# Patient Record
Sex: Female | Born: 1985 | Race: White | Hispanic: No | Smoking: Former smoker
Health system: Southern US, Community
[De-identification: ages and names within clinical notes are randomized; demographics above are authoritative.]

## PROBLEM LIST (undated history)

## (undated) DIAGNOSIS — F419 Anxiety disorder, unspecified: Secondary | ICD-10-CM

## (undated) DIAGNOSIS — T7840XA Allergy, unspecified, initial encounter: Secondary | ICD-10-CM

## (undated) DIAGNOSIS — F321 Major depressive disorder, single episode, moderate: Secondary | ICD-10-CM

## (undated) DIAGNOSIS — D649 Anemia, unspecified: Secondary | ICD-10-CM

## (undated) DIAGNOSIS — G4700 Insomnia, unspecified: Secondary | ICD-10-CM

## (undated) DIAGNOSIS — G43909 Migraine, unspecified, not intractable, without status migrainosus: Secondary | ICD-10-CM

## (undated) HISTORY — DX: Allergy, unspecified, initial encounter: T78.40XA

## (undated) HISTORY — DX: Anxiety disorder, unspecified: F41.9

## (undated) HISTORY — DX: Insomnia, unspecified: G47.00

## (undated) HISTORY — DX: Major depressive disorder, single episode, moderate: F32.1

## (undated) HISTORY — DX: Anemia, unspecified: D64.9

## (undated) HISTORY — DX: Migraine, unspecified, not intractable, without status migrainosus: G43.909

---

## 2009-12-09 HISTORY — PX: WISDOM TOOTH EXTRACTION: SHX21

## 2012-02-21 ENCOUNTER — Ambulatory Visit: Payer: BC Managed Care – PPO | Admitting: Family Medicine

## 2012-11-19 ENCOUNTER — Ambulatory Visit: Payer: Self-pay | Admitting: Family Medicine

## 2013-09-01 ENCOUNTER — Emergency Department: Payer: Self-pay | Admitting: Emergency Medicine

## 2013-09-01 LAB — COMPREHENSIVE METABOLIC PANEL
Albumin: 4.5 g/dL (ref 3.4–5.0)
Alkaline Phosphatase: 72 U/L (ref 50–136)
Anion Gap: 6 — ABNORMAL LOW (ref 7–16)
BUN: 15 mg/dL (ref 7–18)
Bilirubin,Total: 0.6 mg/dL (ref 0.2–1.0)
Calcium, Total: 9.5 mg/dL (ref 8.5–10.1)
Chloride: 107 mmol/L (ref 98–107)
Co2: 26 mmol/L (ref 21–32)
Creatinine: 0.82 mg/dL (ref 0.60–1.30)
EGFR (African American): >60
EGFR (Non-African Amer.): >60
Glucose: 124 mg/dL — ABNORMAL HIGH (ref 65–99)
Osmolality: 280 (ref 275–301)
SGPT (ALT): 21 U/L (ref 12–78)
Sodium: 139 mmol/L (ref 136–145)
Total Protein: 8.1 g/dL (ref 6.4–8.2)

## 2013-09-01 LAB — PREGNANCY, URINE: Pregnancy Test, Urine: NEGATIVE m[IU]/mL

## 2013-09-01 LAB — CBC
HCT: 44.7 % (ref 35.0–47.0)
HGB: 15.6 g/dL (ref 12.0–16.0)
MCHC: 34.8 g/dL (ref 32.0–36.0)
MCV: 90 fL (ref 80–100)
Platelet: 261 10*3/uL (ref 150–440)
RBC: 4.96 10*6/uL (ref 3.80–5.20)
RDW: 11.5 % (ref 11.5–14.5)

## 2013-09-01 LAB — URINALYSIS, COMPLETE
Glucose,UR: NEGATIVE mg/dL (ref 0–75)
Leukocyte Esterase: NEGATIVE
Nitrite: NEGATIVE
Ph: 7 (ref 4.5–8.0)
RBC,UR: NONE SEEN /HPF (ref 0–5)
Specific Gravity: 1.024 (ref 1.003–1.030)
WBC UR: 2 /HPF (ref 0–5)

## 2014-08-30 ENCOUNTER — Observation Stay: Payer: Self-pay | Admitting: Obstetrics and Gynecology

## 2014-09-08 ENCOUNTER — Inpatient Hospital Stay: Payer: Self-pay | Admitting: Obstetrics and Gynecology

## 2014-09-08 LAB — CBC WITH DIFFERENTIAL/PLATELET
Basophil #: 0.1 10*3/uL (ref 0.0–0.1)
Basophil %: 0.5 %
Eosinophil #: 0.1 10*3/uL (ref 0.0–0.7)
Eosinophil %: 1 %
HCT: 40.2 % (ref 35.0–47.0)
HGB: 13.3 g/dL (ref 12.0–16.0)
LYMPHS ABS: 2 10*3/uL (ref 1.0–3.6)
LYMPHS PCT: 13.8 %
MCH: 30.5 pg (ref 26.0–34.0)
MCHC: 33.2 g/dL (ref 32.0–36.0)
MCV: 92 fL (ref 80–100)
Monocyte #: 0.8 x10 3/mm (ref 0.2–0.9)
Monocyte %: 5.3 %
Neutrophil #: 11.5 10*3/uL — ABNORMAL HIGH (ref 1.4–6.5)
Neutrophil %: 79.4 %
Platelet: 206 10*3/uL (ref 150–440)
RBC: 4.36 10*6/uL (ref 3.80–5.20)
RDW: 11.9 % (ref 11.5–14.5)
WBC: 14.5 10*3/uL — ABNORMAL HIGH (ref 3.6–11.0)

## 2014-09-09 LAB — HEMATOCRIT: HCT: 33.6 % — ABNORMAL LOW (ref 35.0–47.0)

## 2015-04-18 NOTE — H&P (Signed)
L&D Evaluation:  History:  HPI 28yo SWF presents at 7360w5d in early labor, Normal PNC to date.   Presents with contractions   Patient's Medical History No Chronic Illness   Patient's Surgical History none   Medications Pre Natal Vitamins  Tylenol (Acetaminophen)   Allergies other, Ceclor   Social History tobacco   ROS:  ROS All systems were reviewed.  HEENT, CNS, GI, GU, Respiratory, CV, Renal and Musculoskeletal systems were found to be normal.   Exam:  Vital Signs stable   General no apparent distress   Mental Status clear   Chest clear   Heart normal sinus rhythm   Abdomen gravid, tender with contractions   Estimated Fetal Weight Average for gestational age   Fetal Position vtx   Back no CVAT   Edema no edema   Pelvic 5.5/90/-1   Mebranes Ruptured   Description bloody, light green   FHT normal rate with no decels   Ucx regular   Impression:  Impression active labor   Plan:  Plan monitor contractions and for cervical change, fluids, AROM. Epidyral when desires   Electronic Signatures: Ulyses AmorBurr, Melody N (CNM)  (Signed 01-Oct-15 08:32)  Authored: L&D Evaluation   Last Updated: 01-Oct-15 08:32 by Ulyses AmorBurr, Melody N (CNM)

## 2015-06-02 ENCOUNTER — Ambulatory Visit (INDEPENDENT_AMBULATORY_CARE_PROVIDER_SITE_OTHER): Payer: Self-pay | Admitting: Family Medicine

## 2015-06-02 ENCOUNTER — Encounter: Payer: Self-pay | Admitting: Family Medicine

## 2015-06-02 ENCOUNTER — Encounter (INDEPENDENT_AMBULATORY_CARE_PROVIDER_SITE_OTHER): Payer: Self-pay

## 2015-06-02 VITALS — BP 90/64 | HR 82 | Temp 97.9°F | Resp 18 | Ht 65.0 in | Wt 90.4 lb

## 2015-06-02 DIAGNOSIS — F339 Major depressive disorder, recurrent, unspecified: Secondary | ICD-10-CM

## 2015-06-02 DIAGNOSIS — F419 Anxiety disorder, unspecified: Secondary | ICD-10-CM | POA: Insufficient documentation

## 2015-06-02 DIAGNOSIS — F331 Major depressive disorder, recurrent, moderate: Secondary | ICD-10-CM | POA: Insufficient documentation

## 2015-06-02 DIAGNOSIS — G43909 Migraine, unspecified, not intractable, without status migrainosus: Secondary | ICD-10-CM | POA: Insufficient documentation

## 2015-06-02 MED ORDER — ESCITALOPRAM OXALATE 10 MG PO TABS: 10.0000 mg | ORAL_TABLET | Freq: Every day | ORAL | Status: DC

## 2015-06-02 MED ORDER — ALPRAZOLAM 0.5 MG PO TABS: 0.5000 mg | ORAL_TABLET | Freq: Two times a day (BID) | ORAL | Status: DC | PRN

## 2015-06-02 NOTE — Progress Notes (Signed)
Name: Annette Bowers   MRN: [id number]    DOB: [birth year]   Date:06/02/2015       Progress Note  Subjective  Chief Complaint  Chief Complaint  Patient presents with  . Depression  . Anxiety    Patient states that the medication doesn't seem to be helping.     Anxiety Presents for follow-up visit. The problem has been gradually worsening. Symptoms include depressed mood, excessive worry, malaise and nervous/anxious behavior. Patient reports no shortness of breath. Symptoms occur most days. The severity of symptoms is moderate. The patient sleeps 8 hours per night. The quality of sleep is good.   Past treatments include benzodiazephines. The treatment provided moderate relief. Compliance with prior treatments has been good.   Depression Patient has history of depression. In the past, she has been on Effexor. She has symptoms of depressed mood, lack of energy, and frequent crying. Stressors include recent birth of her child and work stress.   Past Medical History  Diagnosis Date  . Moderate major depression   . Anxiety   . Migraine   . Insomnia     Past Surgical History  Procedure Laterality Date  . Wisdom tooth extraction  2011    Family History  Problem Relation Age of Onset  . Diabetes Maternal Grandmother   . Diabetes Maternal Grandfather   . Diabetes Father   . Lung cancer Paternal Grandmother   . Heart disease Maternal Grandmother   . Multiple sclerosis Cousin     History   Social History  . Marital Status: Single    Spouse Name: N/A  . Number of Children: 1  . Years of Education: N/A   Occupational History  . Not on file.   Social History Main Topics  . Smoking status: Current Every Day Smoker -- 0.50 packs/day for 8 years    Types: Cigarettes  . Smokeless tobacco: Not on file  . Alcohol Use: No  . Drug Use: No  . Sexual Activity: Not on file   Other Topics Concern  . Not on file   Social History Narrative     Current outpatient  prescriptions:  .  ALPRAZolam (XANAX) 0.25 MG tablet, Take 1 tablet by mouth daily., Disp: , Rfl:   Allergies  Allergen Reactions  . Promethazine Hcl     drowsy  . Topiramate     drowsy  . Trazodone     groggy     Review of Systems  Respiratory: Negative for shortness of breath.   Psychiatric/Behavioral: Positive for depression. The patient is nervous/anxious.       Objective  Filed Vitals:   06/02/15 1201  BP: 90/64  Pulse: 82  Temp: 97.9 F (36.6 C)  Resp: 18  Height:  (1.651 m)  Weight: 90 lb 6.4 oz (41.005 kg)  SpO2: 98%    Physical Exam  Constitutional: She is oriented to person, place, and time and well-developed, well-nourished, and in no distress.  HENT:  Head: Normocephalic and atraumatic.  Cardiovascular: Normal rate and regular rhythm.   Pulmonary/Chest: Effort normal and breath sounds normal.  Neurological: She is alert and oriented to person, place, and time.  Skin: Skin is warm and dry.  Psychiatric: Affect normal.  Nursing note and vitals reviewed.      No results found for this or any previous visit (from the past 2160 hour(s)).   Assessment & Plan 1. Anxiety We will increase alprazolam from 0.25 mg 3 times a day to 0.5 mg  twice a day to address her worsening symptoms of anxiety. Follow-up in one month. - ALPRAZolam (XANAX) 0.5 MG tablet; Take 1 tablet (0.5 mg total) by mouth 2 (two) times daily as needed for anxiety.  Dispense: 60 tablet; Refill: 0  2. Major depression, recurrent, chronic We will start this patient on Lexapro 10 mg daily. She has been educated on the potential adverse effects including unsafe status of Lexapro during pregnancy. She will follow-up in one month. - escitalopram (LEXAPRO) 10 MG tablet; Take 1 tablet (10 mg total) by mouth daily.  Dispense: 30 tablet; Refill: 0  There are no diagnoses linked to this encounter.  Kyeisha Janowicz [facility] 06/02/2015 12:09  PM

## 2015-07-03 ENCOUNTER — Ambulatory Visit: Payer: Medicaid Other | Admitting: Family Medicine

## 2016-03-18 ENCOUNTER — Ambulatory Visit: Payer: Medicaid Other | Admitting: Family Medicine

## 2016-03-19 ENCOUNTER — Ambulatory Visit (INDEPENDENT_AMBULATORY_CARE_PROVIDER_SITE_OTHER): Payer: Self-pay | Admitting: Family Medicine

## 2016-03-19 ENCOUNTER — Encounter: Payer: Self-pay | Admitting: Family Medicine

## 2016-03-19 VITALS — BP 90/62 | HR 113 | Temp 99.3°F | Resp 16 | Ht 65.0 in | Wt 95.1 lb

## 2016-03-19 DIAGNOSIS — F419 Anxiety disorder, unspecified: Secondary | ICD-10-CM

## 2016-03-19 MED ORDER — ALPRAZOLAM 0.5 MG PO TABS: 0.5000 mg | ORAL_TABLET | Freq: Two times a day (BID) | ORAL | Status: DC | PRN

## 2016-03-19 NOTE — Progress Notes (Signed)
Name: Annette Bowers   MRN: [id number]    DOB: 1986-01-08   Date:03/19/2016       Progress Note  Subjective  Chief Complaint  Chief Complaint  Patient presents with  . Follow-up    anxiety    HPI  Anxiety: Pt. Presents for follow up of anxiety. 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 Feels that Annette Bowers 'cannot stop my mind', feels overwhelmed. Annette Bowers feels like Annette Bowers takes a lot of things very personally. Recently, her co-workers have noticed that Annette Bowers seems distant, not very social. Pt. denies any symptoms of depression including fatigue, not wanting to do things that Annette Bowers normally does, ano changes in appetite or sleep (generally a poor sleeper).   Past Medical History  Diagnosis Date  . Moderate major depression (HCC)   . Anxiety   . Migraine   . Insomnia     Past Surgical History  Procedure Laterality Date  . Wisdom tooth extraction  2011    Family History  Problem Relation Age of Onset  . Diabetes Maternal Grandmother   . Diabetes Maternal Grandfather   . Diabetes Father   . Lung cancer Paternal Grandmother   . Heart disease Maternal Grandmother   . Multiple sclerosis Cousin     Social History   Social History  . Marital Status: Single    Spouse Name: N/A  . Number of Children: 1  . Years of Education: N/A   Occupational History  . Not on file.   Social History Main Topics  . Smoking status: Current Every Day Smoker -- 0.50 packs/day for 8 years    Types: Cigarettes  . Smokeless tobacco: Not on file  . Alcohol Use: No  . Drug Use: No  . Sexual Activity: Not on file   Other Topics Concern  . Not on file   Social History Narrative     Current outpatient prescriptions:  .  ALPRAZolam (XANAX) 0.5 MG tablet, Take 1 tablet (0.5 mg total) by mouth 2 (two) times daily as needed for anxiety. (Patient not taking: Reported on 03/19/2016), Disp: 60 tablet, Rfl: 0 .   escitalopram (LEXAPRO) 10 MG tablet, Take 1 tablet (10 mg total) by mouth daily. (Patient not taking: Reported on 03/19/2016), Disp: 30 tablet, Rfl: 0  Allergies  Allergen Reactions  . Promethazine Hcl     drowsy  . Topiramate     drowsy  . Trazodone     groggy    Review of Systems  Psychiatric/Behavioral: Positive for depression. The patient is nervous/anxious.     Objective  Filed Vitals:   03/19/16 1526  BP: 90/62  Pulse: 113  Temp: 99.3 F (37.4 C)  TempSrc: Oral  Resp: 16  Height: 5\' 5"  (1.651 m)  Weight: 95 lb 1.6 oz (43.137 kg)  SpO2: 97%    Physical Exam  Constitutional: Annette Bowers is oriented to person, place, and time and well-developed, well-nourished, and in no distress.  HENT:  Head: Normocephalic and atraumatic.  Neurological: Annette Bowers is alert and oriented to person, place, and time.  Psychiatric: Mood, memory, affect and judgment normal.  Nursing note and vitals reviewed.     Assessment & Plan  1. Anxiety Annette Bowers expressing significant anxiety because of multiple social, family, and work stressors. We'll start on alprazolam 0.5 mg to be taken twice daily as needed. Follow-up in one month with possible addition of SSRI therapy. Patient verbalized agreement. - ALPRAZolam (XANAX) 0.5 MG  tablet; Take 1 tablet (0.5 mg total) by mouth 2 (two) times daily as needed for anxiety.  Dispense: 60 tablet; Refill: 0   Janye Maynor [facility] 03/19/2016 4:11 PM

## 2016-04-18 ENCOUNTER — Ambulatory Visit (INDEPENDENT_AMBULATORY_CARE_PROVIDER_SITE_OTHER): Payer: Self-pay | Admitting: Family Medicine

## 2016-04-18 ENCOUNTER — Encounter: Payer: Self-pay | Admitting: Family Medicine

## 2016-04-18 VITALS — BP 93/68 | HR 108 | Temp 99.0°F | Resp 17 | Ht 65.0 in | Wt 95.4 lb

## 2016-04-18 DIAGNOSIS — F419 Anxiety disorder, unspecified: Secondary | ICD-10-CM

## 2016-04-18 MED ORDER — ALPRAZOLAM 0.5 MG PO TABS: 0.5000 mg | ORAL_TABLET | Freq: Three times a day (TID) | ORAL | Status: DC | PRN

## 2016-04-18 NOTE — Progress Notes (Signed)
Name: Annette BusmanLindsey A Nee   MRN: [id number]    DOB: 1986-06-23   Date:04/18/2016       Progress Note  Subjective  Chief Complaint  Chief Complaint  Patient presents with  . Follow-up    1 mo    Anxiety Presents for follow-up visit. The problem has been unchanged. Symptoms include excessive worry, insomnia, nervous/anxious behavior and panic. Patient reports no suicidal ideas. Primary symptoms comment: Constantly stressed during the day, multiple things on her mind, at night she is not able to fall asleep. The severity of symptoms is moderate and causing significant distress.   Past treatments include benzodiazephines.   Patient is taking alprazolam in the morning and at night and requesting to add the afternoon dosage for better control of symptoms of anxiety during the day.  Past Medical History  Diagnosis Date  . Moderate major depression (HCC)   . Anxiety   . Migraine   . Insomnia     Past Surgical History  Procedure Laterality Date  . Wisdom tooth extraction  2011    Family History  Problem Relation Age of Onset  . Diabetes Maternal Grandmother   . Diabetes Maternal Grandfather   . Diabetes Father   . Lung cancer Paternal Grandmother   . Heart disease Maternal Grandmother   . Multiple sclerosis Cousin     Social History   Social History  . Marital Status: Single    Spouse Name: N/A  . Number of Children: 1  . Years of Education: N/A   Occupational History  . Not on file.   Social History Main Topics  . Smoking status: Current Every Day Smoker -- 0.50 packs/day for 8 years    Types: Cigarettes  . Smokeless tobacco: Not on file  . Alcohol Use: No  . Drug Use: No  . Sexual Activity: Not on file   Other Topics Concern  . Not on file   Social History Narrative     Current outpatient prescriptions:  .  ALPRAZolam (XANAX) 0.5 MG tablet, Take 1 tablet (0.5 mg total) by mouth 2 (two) times daily as needed for anxiety., Disp: 60 tablet, Rfl: 0  Allergies   Allergen Reactions  . Promethazine Hcl     drowsy  . Topiramate     drowsy  . Trazodone     groggy     Review of Systems  Psychiatric/Behavioral: Negative for suicidal ideas. The patient is nervous/anxious and has insomnia.     Objective  Filed Vitals:   04/18/16 1402  BP: 93/68  Pulse: 108  Temp: 99 F (37.2 C)  TempSrc: Oral  Resp: 17  Height: 5\' 5"  (1.651 m)  Weight: 95 lb 6.4 oz (43.273 kg)  SpO2: 96%    Physical Exam  Constitutional: She is oriented to person, place, and time and well-developed, well-nourished, and in no distress.  Cardiovascular: Normal rate and regular rhythm.   Pulmonary/Chest: Effort normal and breath sounds normal.  Neurological: She is alert and oriented to person, place, and time.  Psychiatric: Mood, memory, affect and judgment normal.  Nursing note and vitals reviewed.      Assessment & Plan  1. Anxiety Stable, continue on alprazolam, we'll increase to 3 times daily, follow-up in one month. - ALPRAZolam (XANAX) 0.5 MG tablet; Take 1 tablet (0.5 mg total) by mouth 3 (three) times daily as needed for anxiety.  Dispense: 90 tablet; Refill: 0   Avonell Lenig [facility]ad [facility] 04/18/2016 2:14  PM

## 2016-05-21 ENCOUNTER — Encounter: Payer: Self-pay | Admitting: Family Medicine

## 2016-05-21 ENCOUNTER — Ambulatory Visit (INDEPENDENT_AMBULATORY_CARE_PROVIDER_SITE_OTHER): Payer: Self-pay | Admitting: Family Medicine

## 2016-05-21 VITALS — BP 98/70 | HR 101 | Temp 98.9°F | Resp 16 | Ht 65.0 in | Wt 100.5 lb

## 2016-05-21 DIAGNOSIS — F339 Major depressive disorder, recurrent, unspecified: Secondary | ICD-10-CM

## 2016-05-21 DIAGNOSIS — F419 Anxiety disorder, unspecified: Secondary | ICD-10-CM

## 2016-05-21 MED ORDER — SERTRALINE HCL 50 MG PO TABS: 50.0000 mg | ORAL_TABLET | Freq: Every day | ORAL | Status: DC

## 2016-05-21 MED ORDER — ALPRAZOLAM 0.5 MG PO TABS: 0.5000 mg | ORAL_TABLET | Freq: Three times a day (TID) | ORAL | Status: DC | PRN

## 2016-05-21 NOTE — Progress Notes (Signed)
Name: Annette Bowers   MRN: [id number]    DOB: 11-Jul-1986   Date:05/21/2016       Progress Note  Subjective  Chief Complaint  Chief Complaint  Patient presents with  . Follow-up    1 mo  . Medication Refill    xanax    Anxiety Presents for follow-up visit. The problem has been unchanged. Symptoms include depressed mood, excessive worry, muscle tension and nervous/anxious behavior. Patient reports no insomnia or suicidal ideas. The severity of symptoms is moderate and causing significant distress. The symptoms are aggravated by work stress.   Her past medical history is significant for depression. Past treatments include benzodiazephines. Compliance with prior treatments has been good.  Depression      The patient presents with depression.  This is a recurrent problem.  The onset quality is gradual.   Duration: FIrst diagnosed in 2012.    The problem has been gradually worsening since onset.  Associated symptoms include fatigue, hopelessness and sad.  Associated symptoms include does not have insomnia and no suicidal ideas.  Past medical history includes anxiety and depression.      Past Medical History  Diagnosis Date  . Moderate major depression (HCC)   . Anxiety   . Migraine   . Insomnia     Past Surgical History  Procedure Laterality Date  . Wisdom tooth extraction  2011    Family History  Problem Relation Age of Onset  . Diabetes Maternal Grandmother   . Diabetes Maternal Grandfather   . Diabetes Father   . Lung cancer Paternal Grandmother   . Heart disease Maternal Grandmother   . Multiple sclerosis Cousin     Social History   Social History  . Marital Status: Single    Spouse Name: N/A  . Number of Children: 1  . Years of Education: N/A   Occupational History  . Not on file.   Social History Main Topics  . Smoking status: Current Every Day Smoker -- 0.50 packs/day for 8 years    Types: Cigarettes  . Smokeless tobacco: Not on file  . Alcohol Use: No   . Drug Use: No  . Sexual Activity: Not on file   Other Topics Concern  . Not on file   Social History Narrative     Current outpatient prescriptions:  .  ALPRAZolam (XANAX) 0.5 MG tablet, Take 1 tablet (0.5 mg total) by mouth 3 (three) times daily as needed for anxiety., Disp: 90 tablet, Rfl: 0  Allergies  Allergen Reactions  . Promethazine Hcl     drowsy  . Topiramate     drowsy  . Trazodone     groggy    Review of Systems  Constitutional: Positive for fatigue.  Psychiatric/Behavioral: Positive for depression. Negative for suicidal ideas. The patient is nervous/anxious. The patient does not have insomnia.     Objective  Filed Vitals:   05/21/16 1335  BP: 98/70  Pulse: 101  Temp: 98.9 F (37.2 C)  TempSrc: Oral  Resp: 16  Height:  (1.651 m)  Weight: 100 lb 8 oz (45.587 kg)  SpO2: 98%    Physical Exam  Constitutional: She is oriented to person, place, and time and well-developed, well-nourished, and in no distress.  HENT:  Head: Normocephalic and atraumatic.  Cardiovascular: Normal rate, regular rhythm and normal heart sounds.  Exam reveals no gallop.   No murmur heard. Pulmonary/Chest: Effort normal and breath sounds normal. She has no wheezes. She has no rales.  Neurological: She is alert and oriented to person, place, and time.  Psychiatric: Mood, memory, affect and judgment normal. She is not agitated. She expresses no suicidal ideation.  Nursing note and vitals reviewed.     Assessment & Plan  1. Anxiety Symptoms of anxiety are stable on Xanax taken 3 times daily as needed - ALPRAZolam (XANAX) 0.5 MG tablet; Take 1 tablet (0.5 mg total) by mouth 3 (three) times daily as needed for anxiety.  Dispense: 90 tablet; Refill: 0  2. Major depression, recurrent, chronic (HCC) Worsening symptoms of depression, we will start on SSRI therapy, follow-up in 6 weeks. - sertraline (ZOLOFT) 50 MG tablet; Take 1 tablet (50 mg total) by mouth daily.  Dispense:  30 tablet; Refill: 2   Tersa Fotopoulos [facility]ad [facility] 05/21/2016 1:48 PM

## 2016-06-13 ENCOUNTER — Encounter: Payer: Self-pay | Admitting: Family Medicine

## 2016-06-13 ENCOUNTER — Ambulatory Visit (INDEPENDENT_AMBULATORY_CARE_PROVIDER_SITE_OTHER): Payer: Self-pay | Admitting: Family Medicine

## 2016-06-13 VITALS — BP 99/68 | HR 96 | Temp 98.9°F | Resp 15 | Ht 65.0 in | Wt 100.6 lb

## 2016-06-13 DIAGNOSIS — F419 Anxiety disorder, unspecified: Secondary | ICD-10-CM

## 2016-06-13 MED ORDER — ALPRAZOLAM 0.5 MG PO TABS: 0.5000 mg | ORAL_TABLET | Freq: Three times a day (TID) | ORAL | Status: DC | PRN

## 2016-06-13 NOTE — Progress Notes (Signed)
Name: Annette Bowers   MRN: [id number]    DOB: 1986/03/28   Date:06/13/2016       Progress Note  Subjective  Chief Complaint  Chief Complaint  Patient presents with  . Follow-up    1  mo  . Medication Refill    Anxiety Presents for follow-up visit. The problem has been gradually improving. Symptoms include depressed mood, excessive worry, muscle tension and nervous/anxious behavior. The severity of symptoms is moderate and causing significant distress. The symptoms are aggravated by work stress.   Past treatments include benzodiazephines and SSRIs. Compliance with prior treatments has been good.     Past Medical History  Diagnosis Date  . Moderate major depression (HCC)   . Anxiety   . Migraine   . Insomnia     Past Surgical History  Procedure Laterality Date  . Wisdom tooth extraction  2011    Family History  Problem Relation Age of Onset  . Diabetes Maternal Grandmother   . Diabetes Maternal Grandfather   . Diabetes Father   . Lung cancer Paternal Grandmother   . Heart disease Maternal Grandmother   . Multiple sclerosis Cousin     Social History   Social History  . Marital Status: Single    Spouse Name: N/A  . Number of Children: 1  . Years of Education: N/A   Occupational History  . Not on file.   Social History Main Topics  . Smoking status: Current Every Day Smoker -- 0.50 packs/day for 8 years    Types: Cigarettes  . Smokeless tobacco: Not on file  . Alcohol Use: No  . Drug Use: No  . Sexual Activity: Not on file   Other Topics Concern  . Not on file   Social History Narrative     Current outpatient prescriptions:  .  ALPRAZolam (XANAX) 0.5 MG tablet, Take 1 tablet (0.5 mg total) by mouth 3 (three) times daily as needed for anxiety., Disp: 90 tablet, Rfl: 0 .  sertraline (ZOLOFT) 50 MG tablet, Take 1 tablet (50 mg total) by mouth daily., Disp: 30 tablet, Rfl: 2  Allergies  Allergen Reactions  . Promethazine Hcl     drowsy  . Topiramate      drowsy  . Trazodone     groggy     Review of Systems  Psychiatric/Behavioral: The patient is nervous/anxious.      Objective  Filed Vitals:   06/13/16 1555  BP: 99/68  Pulse: 96  Temp: 98.9 F (37.2 C)  TempSrc: Oral  Resp: 15  Height: 5\' 5"  (1.651 m)  Weight: 100 lb 9.6 oz (45.632 kg)  SpO2: 98%    Physical Exam  Constitutional: She is oriented to person, place, and time and well-developed, well-nourished, and in no distress.  Neurological: She is alert and oriented to person, place, and time.  Psychiatric: Mood, memory, affect and judgment normal. She is not agitated. She expresses no suicidal ideation.  Nursing note and vitals reviewed.       Assessment & Plan  1. Anxiety Stable and improving on alprazolam taken up to 3 times daily as needed. Refills provided and follow-up in one month. - ALPRAZolam (XANAX) 0.5 MG tablet; Take 1 tablet (0.5 mg total) by mouth 3 (three) times daily as needed for anxiety.  Dispense: 90 tablet; Refill: 0   Annette Bowers Asad A[facility] 06/13/2016 4:05 PM

## 2016-06-25 ENCOUNTER — Ambulatory Visit: Payer: Self-pay | Admitting: Family Medicine

## 2016-07-25 ENCOUNTER — Ambulatory Visit (INDEPENDENT_AMBULATORY_CARE_PROVIDER_SITE_OTHER): Payer: Self-pay | Admitting: Family Medicine

## 2016-07-25 ENCOUNTER — Encounter: Payer: Self-pay | Admitting: Family Medicine

## 2016-07-25 DIAGNOSIS — F419 Anxiety disorder, unspecified: Secondary | ICD-10-CM

## 2016-07-25 DIAGNOSIS — F339 Major depressive disorder, recurrent, unspecified: Secondary | ICD-10-CM

## 2016-07-25 DIAGNOSIS — L84 Corns and callosities: Secondary | ICD-10-CM | POA: Insufficient documentation

## 2016-07-25 DIAGNOSIS — R739 Hyperglycemia, unspecified: Secondary | ICD-10-CM | POA: Insufficient documentation

## 2016-07-25 MED ORDER — SERTRALINE HCL 50 MG PO TABS: 50.0000 mg | ORAL_TABLET | Freq: Every day | ORAL | 0 refills | Status: DC

## 2016-07-25 MED ORDER — ALPRAZOLAM 0.5 MG PO TABS: 0.5000 mg | ORAL_TABLET | Freq: Three times a day (TID) | ORAL | 2 refills | Status: DC | PRN

## 2016-07-25 NOTE — Progress Notes (Signed)
Name: Annette Bowers   MRN: [id number]    DOB: 03/12/1986   Date:07/25/2016       Progress Note  Subjective  Chief Complaint  Chief Complaint  Patient presents with  . Medication Refill    Anxiety  Presents for follow-up visit. The problem has been gradually improving. Symptoms include depressed mood, excessive worry, irritability, muscle tension and nervous/anxious behavior. The severity of symptoms is moderate and causing significant distress. The symptoms are aggravated by work stress.   Past treatments include benzodiazephines and SSRIs. Compliance with prior treatments has been good.   Corn on Left Foot: Present for about a month, started with pain at the bottom of the left foot, then a lesion appeared the size of a pimple and steadily grew larger. Now, its a whitish lesion, painful to walk on, worse with pressure. She has used Dr. Margart Bowers's liquid corn remover with no relief.   Past Medical History:  Diagnosis Date  . Anxiety   . Insomnia   . Migraine   . Moderate major depression (HCC)     Past Surgical History:  Procedure Laterality Date  . WISDOM TOOTH EXTRACTION  2011    Family History  Problem Relation Age of Onset  . Diabetes Maternal Grandmother   . Diabetes Maternal Grandfather   . Diabetes Father   . Lung cancer Paternal Grandmother   . Heart disease Maternal Grandmother   . Multiple sclerosis Cousin     Social History   Social History  . Marital status: Single    Spouse name: N/A  . Number of children: 1  . Years of education: N/A   Occupational History  . Not on file.   Social History Main Topics  . Smoking status: Current Every Day Smoker    Packs/day: 0.50    Years: 8.00    Types: Cigarettes  . Smokeless tobacco: Not on file  . Alcohol use No  . Drug use: No  . Sexual activity: Not on file   Other Topics Concern  . Not on file   Social History Narrative  . No narrative on file     Current Outpatient Prescriptions:  .   ALPRAZolam (XANAX) 0.5 MG tablet, Take 1 tablet (0.5 mg total) by mouth 3 (three) times daily as needed for anxiety., Disp: 90 tablet, Rfl: 0 .  sertraline (ZOLOFT) 50 MG tablet, Take 1 tablet (50 mg total) by mouth daily., Disp: 30 tablet, Rfl: 2  Allergies  Allergen Reactions  . Promethazine Hcl     drowsy  . Topiramate     drowsy  . Trazodone     groggy     Review of Systems  Constitutional: Positive for irritability and malaise/fatigue. Negative for chills and fever.  Psychiatric/Behavioral: Positive for depression. The patient is nervous/anxious.     Objective  Vitals:   07/25/16 1613  BP: 108/78  Pulse: 94  Resp: 14  Temp: 98.7 F (37.1 C)  TempSrc: Oral  SpO2: 97%  Weight: 103 lb (46.7 kg)    Physical Exam  Constitutional: She is oriented to person, place, and time and well-developed, well-nourished, and in no distress.  HENT:  Head: Normocephalic and atraumatic.  Cardiovascular: Normal rate, regular rhythm and normal heart sounds.  Exam reveals no gallop.   Pulmonary/Chest: Effort normal and breath sounds normal. She has no wheezes. She has no rales.  Neurological: She is alert and oriented to person, place, and time.  Psychiatric: Mood, memory, affect and judgment normal. She is  not agitated. She expresses no suicidal ideation.  Nursing note and vitals reviewed.   Assessment & Plan  1. Anxiety Stable, responsive to alprazolam taken up to 3 times daily as needed. Patient aware of dependence potential, and side effects of alprazolam. Refills provided - ALPRAZolam (XANAX) 0.5 MG tablet; Take 1 tablet (0.5 mg total) by mouth 3 (three) times daily as needed for anxiety.  Dispense: 90 tablet; Refill: 2  2. Major depression, recurrent, chronic (HCC) In remission continue on sertraline as prescribed - sertraline (ZOLOFT) 50 MG tablet; Take 1 tablet (50 mg total) by mouth daily.  Dispense: 90 tablet; Refill: 0  3. Corn of foot  - Ambulatory referral to  Podiatry   Annette Bowers Asad A. [facility] 07/25/2016 4:24 PM

## 2016-08-06 ENCOUNTER — Ambulatory Visit: Payer: Self-pay | Admitting: Podiatry

## 2016-10-24 ENCOUNTER — Ambulatory Visit (INDEPENDENT_AMBULATORY_CARE_PROVIDER_SITE_OTHER): Payer: Self-pay | Admitting: Family Medicine

## 2016-10-24 ENCOUNTER — Encounter: Payer: Self-pay | Admitting: Family Medicine

## 2016-10-24 DIAGNOSIS — F419 Anxiety disorder, unspecified: Secondary | ICD-10-CM

## 2016-10-24 DIAGNOSIS — F339 Major depressive disorder, recurrent, unspecified: Secondary | ICD-10-CM

## 2016-10-24 MED ORDER — ALPRAZOLAM 0.5 MG PO TABS: 0.5000 mg | ORAL_TABLET | Freq: Three times a day (TID) | ORAL | 2 refills | Status: DC | PRN

## 2016-10-24 MED ORDER — SERTRALINE HCL 50 MG PO TABS: 50.0000 mg | ORAL_TABLET | Freq: Every day | ORAL | 0 refills | Status: DC

## 2016-10-24 NOTE — Progress Notes (Signed)
Name: Annette BusmanLindsey A Bodey   MRN: [id number]    DOB: 08-02-1986   Date:10/24/2016       Progress Note  Subjective  Chief Complaint  Chief Complaint  Patient presents with  . Follow-up    1 mo  . Medication Refill    xanax / zoloft    Anxiety  Presents for follow-up visit. The problem has been unchanged. Symptoms include depressed mood, excessive worry, muscle tension, nervous/anxious behavior and restlessness. Patient reports no insomnia or suicidal ideas. The severity of symptoms is moderate and causing significant distress. The symptoms are aggravated by work stress. Nighttime awakenings: one to two.   Her past medical history is significant for depression. Past treatments include benzodiazephines. Compliance with prior treatments has been good.  Depression       The patient presents with depression.  This is a recurrent problem.  The onset quality is gradual.   Duration: FIrst diagnosed in 2012.    The problem has been gradually improving since onset.  Associated symptoms include fatigue, hopelessness, restlessness and sad.  Associated symptoms include does not have insomnia and no suicidal ideas.  Past treatments include SSRIs - Selective serotonin reuptake inhibitors.  Compliance with treatment is good.  Past medical history includes anxiety and depression.      Past Medical History:  Diagnosis Date  . Anxiety   . Insomnia   . Migraine   . Moderate major depression (HCC)     Past Surgical History:  Procedure Laterality Date  . WISDOM TOOTH EXTRACTION  2011    Family History  Problem Relation Age of Onset  . Diabetes Father   . Diabetes Maternal Grandmother   . Heart disease Maternal Grandmother   . Diabetes Maternal Grandfather   . Lung cancer Paternal Grandmother   . Multiple sclerosis Cousin     Social History   Social History  . Marital status: Single    Spouse name: N/A  . Number of children: 1  . Years of education: N/A   Occupational History  . Not on  file.   Social History Main Topics  . Smoking status: Current Every Day Smoker    Packs/day: 0.50    Years: 8.00    Types: Cigarettes  . Smokeless tobacco: Never Used  . Alcohol use No  . Drug use: No  . Sexual activity: Not on file   Other Topics Concern  . Not on file   Social History Narrative  . No narrative on file     Current Outpatient Prescriptions:  .  ALPRAZolam (XANAX) 0.5 MG tablet, Take 1 tablet (0.5 mg total) by mouth 3 (three) times daily as needed for anxiety., Disp: 90 tablet, Rfl: 2 .  sertraline (ZOLOFT) 50 MG tablet, Take 1 tablet (50 mg total) by mouth daily., Disp: 90 tablet, Rfl: 0  Allergies  Allergen Reactions  . Promethazine Hcl     drowsy  . Topiramate     drowsy  . Trazodone     groggy     Review of Systems  Constitutional: Positive for fatigue.  Psychiatric/Behavioral: Positive for depression. Negative for suicidal ideas. The patient is nervous/anxious. The patient does not have insomnia.      Objective  Vitals:   10/24/16 1450  BP: 107/68  Pulse: 86  Resp: 16  Temp: 98.2 F (36.8 C)  TempSrc: Oral  SpO2: 97%  Weight: 114 lb (51.7 kg)  Height: 5\' 1"  (1.549 m)    Physical Exam  Constitutional: She is  oriented to person, place, and time and well-developed, well-nourished, and in no distress.  HENT:  Head: Normocephalic and atraumatic.  Cardiovascular: Normal rate, regular rhythm and normal heart sounds.  Exam reveals no gallop.   Pulmonary/Chest: Effort normal and breath sounds normal. She has no wheezes. She has no rales.  Neurological: She is alert and oriented to person, place, and time.  Psychiatric: Mood, memory, affect and judgment normal. She is not agitated. She expresses no suicidal ideation.  Nursing note and vitals reviewed.     Assessment & Plan  1. Anxiety Stable and responsive to alprazolam taken up to 3 times a day when needed - ALPRAZolam (XANAX) 0.5 MG tablet; Take 1 tablet (0.5 mg total) by mouth 3  (three) times daily as needed for anxiety.  Dispense: 90 tablet; Refill: 2  2. Major depression, recurrent, chronic (HCC)  - sertraline (ZOLOFT) 50 MG tablet; Take 1 tablet (50 mg total) by mouth daily.  Dispense: 90 tablet; Refill: 0   Anarely Nicholls Asad A. Faylene KurtzShah [facility] 10/24/2016 3:00 PM

## 2016-10-29 ENCOUNTER — Other Ambulatory Visit: Payer: Self-pay | Admitting: Family Medicine

## 2016-10-29 DIAGNOSIS — F339 Major depressive disorder, recurrent, unspecified: Secondary | ICD-10-CM

## 2016-11-28 ENCOUNTER — Encounter: Payer: Self-pay | Admitting: Family Medicine

## 2016-12-12 ENCOUNTER — Telehealth: Payer: Self-pay | Admitting: Family Medicine

## 2016-12-12 NOTE — Telephone Encounter (Signed)
Patient has a question regarding her alprazolam refill.

## 2016-12-13 NOTE — Telephone Encounter (Signed)
Patient is returning someone call.

## 2016-12-16 NOTE — Telephone Encounter (Signed)
Patient has 2 additional refills after the first prescription, she should confirm with the pharmacy.

## 2016-12-16 NOTE — Telephone Encounter (Signed)
Pt is in need of a refill of her Alprazolam. Please advise.

## 2016-12-17 NOTE — Telephone Encounter (Signed)
Patient reports that she picked up her prescription of alprazolam at the drive thru of [facility] [address] on Thursday, 12/12/2016 and accidentally ran over the prescription bottle after picking it up at the drive thru window. She reports the pharmacist Hadel saw everything that happened and informed her that if the Dr. Robinette Hainesokays it, she can get a new prescription. We will need to confirm with the pharmacist Hadel on phone #[phone], please connect me to the pharmacist and would like to discuss and get her account of events that night.

## 2016-12-17 NOTE — Telephone Encounter (Signed)
After calling patient back this morning she understands she has refills. Pt would like a call back so she can explain what happened to the medication. Please return her call.

## 2016-12-19 ENCOUNTER — Encounter: Payer: Self-pay | Admitting: Family Medicine

## 2016-12-19 NOTE — Telephone Encounter (Signed)
Spoke to [employer]adel at pharmacy and she verified story to Dr. Sherryll BurgerShah. Patient husband ran over script and tablets were crushed. Per Dr. Sherryll BurgerShah he oked 1 refill for patient to get early. Pharmacy notified

## 2017-01-03 ENCOUNTER — Encounter: Payer: Self-pay | Admitting: Family Medicine

## 2017-01-21 ENCOUNTER — Other Ambulatory Visit: Payer: Self-pay | Admitting: Family Medicine

## 2017-01-21 ENCOUNTER — Encounter: Payer: Self-pay | Admitting: Family Medicine

## 2017-01-21 ENCOUNTER — Ambulatory Visit (INDEPENDENT_AMBULATORY_CARE_PROVIDER_SITE_OTHER): Payer: Self-pay | Admitting: Family Medicine

## 2017-01-21 VITALS — BP 100/66 | HR 120 | Temp 99.2°F | Resp 17 | Ht 61.0 in | Wt 104.7 lb

## 2017-01-21 DIAGNOSIS — F419 Anxiety disorder, unspecified: Secondary | ICD-10-CM

## 2017-01-21 DIAGNOSIS — Z0000 Encounter for general adult medical examination without abnormal findings: Secondary | ICD-10-CM

## 2017-01-21 DIAGNOSIS — F339 Major depressive disorder, recurrent, unspecified: Secondary | ICD-10-CM

## 2017-01-21 LAB — POCT GLYCOSYLATED HEMOGLOBIN (HGB A1C): HEMOGLOBIN A1C: 5.3

## 2017-01-21 MED ORDER — ALPRAZOLAM 0.5 MG PO TABS: 0.5000 mg | ORAL_TABLET | Freq: Three times a day (TID) | ORAL | 2 refills | Status: DC | PRN

## 2017-01-21 MED ORDER — SERTRALINE HCL 50 MG PO TABS: 50.0000 mg | ORAL_TABLET | Freq: Every day | ORAL | 0 refills | Status: DC

## 2017-01-21 NOTE — Progress Notes (Signed)
Name: Annette Bowers   MRN: [id number]    DOB: 1986-03-17   Date:01/21/2017       Progress Note  Subjective  Chief Complaint  Chief Complaint  Patient presents with  . Annual Exam    CPE w/o pap  . Medication Refill    Anxiety  Presents for follow-up visit. Symptoms include depressed mood, excessive worry, insomnia, nervous/anxious behavior and panic. Patient reports no chest pain, nausea, restlessness or shortness of breath. The severity of symptoms is moderate.    Depression         This is a chronic problem.  Associated symptoms include fatigue, insomnia, irritable, headaches and sad.  Associated symptoms include no helplessness, no restlessness and no myalgias.  Past treatments include SSRIs - Selective serotonin reuptake inhibitors.  Past medical history includes anxiety.     Pertinent negatives include no fibromyalgia.  Pt. Presents for Complete Physical Exam,  She sees Gynecology for Pap smear and other screenings.    Past Medical History:  Diagnosis Date  . Anxiety   . Insomnia   . Migraine   . Moderate major depression (HCC)     Past Surgical History:  Procedure Laterality Date  . WISDOM TOOTH EXTRACTION  2011    Family History  Problem Relation Age of Onset  . Diabetes Father   . Diabetes Maternal Grandmother   . Heart disease Maternal Grandmother   . Diabetes Maternal Grandfather   . Lung cancer Paternal Grandmother   . Multiple sclerosis Cousin     Social History   Social History  . Marital status: Single    Spouse name: N/A  . Number of children: 1  . Years of education: N/A   Occupational History  . Not on file.   Social History Main Topics  . Smoking status: Current Every Day Smoker    Packs/day: 0.50    Years: 8.00    Types: Cigarettes  . Smokeless tobacco: Never Used  . Alcohol use No  . Drug use: No  . Sexual activity: Not on file   Other Topics Concern  . Not on file   Social History Narrative  . No narrative on file      Current Outpatient Prescriptions:  .  ALPRAZolam (XANAX) 0.5 MG tablet, Take 1 tablet (0.5 mg total) by mouth 3 (three) times daily as needed for anxiety., Disp: 90 tablet, Rfl: 2 .  sertraline (ZOLOFT) 50 MG tablet, Take 1 tablet (50 mg total) by mouth daily., Disp: 90 tablet, Rfl: 0  Allergies  Allergen Reactions  . Promethazine Hcl     drowsy  . Topiramate     drowsy  . Trazodone     groggy     Review of Systems  Constitutional: Positive for fatigue and malaise/fatigue. Negative for chills and fever.  HENT: Negative for ear pain, sinus pain (occasional sinus pain due to allergies) and sore throat.   Eyes: Negative for blurred vision and double vision.  Respiratory: Negative for cough, sputum production and shortness of breath.   Cardiovascular: Negative for chest pain and leg swelling.  Gastrointestinal: Negative for abdominal pain, blood in stool, constipation, diarrhea, nausea and vomiting.  Genitourinary: Negative for dysuria and hematuria.  Musculoskeletal: Negative for back pain, myalgias and neck pain.  Neurological: Positive for headaches.  Psychiatric/Behavioral: Positive for depression. The patient is nervous/anxious and has insomnia.      Objective  Vitals:   01/21/17 0846  BP: 100/66  Pulse: (!) 120  Resp: 17  Temp: 99.2 F (37.3 C)  TempSrc: Oral  SpO2: 98%  Weight: 104 lb 11.2 oz (47.5 kg)  Height: 5\' 1"  (1.549 m)    Physical Exam  Constitutional: She is oriented to person, place, and time and well-developed, well-nourished, and in no distress. She is irritable.  HENT:  Head: Normocephalic and atraumatic.  Eyes: EOM are normal. Pupils are equal, round, and reactive to light.  Neck: Neck supple.  Cardiovascular: Normal rate, regular rhythm and normal heart sounds.   No murmur heard. Pulmonary/Chest: Effort normal and breath sounds normal. She has no wheezes.  Abdominal: Soft. Bowel sounds are normal. There is no tenderness.   Musculoskeletal: She exhibits no edema.  Neurological: She is alert and oriented to person, place, and time.  Skin: Skin is warm and dry.  Psychiatric: Mood, memory, affect and judgment normal.  Nursing note and vitals reviewed.     Assessment & Plan 1. Well woman exam without gynecological exam Obtain age-appropriate laboratory screenings. - TSH - VITAMIN D 25 Hydroxy (Vit-D Deficiency, Fractures) - CBC with Differential/Platelet - COMPLETE METABOLIC PANEL WITH GFR - POCT glycosylated hemoglobin (Hb A1C) - Lipid panel  2. Anxiety Continue on Alprazolam as prescribed. - ALPRAZolam (XANAX) 0.5 MG tablet; Take 1 tablet (0.5 mg total) by mouth 3 (three) times daily as needed for anxiety.  Dispense: 90 tablet; Refill: 2  3. Major depression, recurrent, chronic (HCC)  - sertraline (ZOLOFT) 50 MG tablet; Take 1 tablet (50 mg total) by mouth daily.  Dispense: 90 tablet; Refill: 0   Annette Bowers Asad A. Faylene KurtzShah [facility] 01/21/2017 8:59 AM

## 2017-01-22 LAB — CBC WITH DIFFERENTIAL/PLATELET
Basophils Absolute: 0 10*3/uL (ref 0.0–0.2)
Basos: 0 %
EOS (ABSOLUTE): 0.3 10*3/uL (ref 0.0–0.4)
Eos: 3 %
Hematocrit: 39.6 % (ref 34.0–46.6)
Hemoglobin: 13.9 g/dL (ref 11.1–15.9)
Immature Grans (Abs): 0 10*3/uL (ref 0.0–0.1)
Immature Granulocytes: 0 %
Lymphocytes Absolute: 1.8 10*3/uL (ref 0.7–3.1)
Lymphs: 19 %
MCH: 31.6 pg (ref 26.6–33.0)
MCHC: 35.1 g/dL (ref 31.5–35.7)
MCV: 90 fL (ref 79–97)
Monocytes Absolute: 0.6 10*3/uL (ref 0.1–0.9)
Monocytes: 6 %
Neutrophils Absolute: 6.7 10*3/uL (ref 1.4–7.0)
Neutrophils: 72 %
Platelets: 223 10*3/uL (ref 150–379)
RBC: 4.4 x10E6/uL (ref 3.77–5.28)
RDW: 12.3 % (ref 12.3–15.4)
WBC: 9.4 10*3/uL (ref 3.4–10.8)

## 2017-01-22 LAB — COMPREHENSIVE METABOLIC PANEL
ALT: 12 IU/L (ref 0–32)
AST: 14 IU/L (ref 0–40)
Albumin/Globulin Ratio: 1.8 (ref 1.2–2.2)
Albumin: 4.6 g/dL (ref 3.5–5.5)
Alkaline Phosphatase: 46 IU/L (ref 39–117)
BUN/Creatinine Ratio: 14 (ref 9–23)
BUN: 9 mg/dL (ref 6–20)
Bilirubin Total: 0.3 mg/dL (ref 0.0–1.2)
CO2: 24 mmol/L (ref 18–29)
Calcium: 8.9 mg/dL (ref 8.7–10.2)
Chloride: 100 mmol/L (ref 96–106)
Creatinine, Ser: 0.63 mg/dL (ref 0.57–1.00)
GFR calc Af Amer: 141 mL/min/{1.73_m2} (ref >59)
GFR calc non Af Amer: 122 mL/min/{1.73_m2} (ref >59)
Globulin, Total: 2.6 g/dL (ref 1.5–4.5)
Glucose: 97 mg/dL (ref 65–99)
Potassium: 4.4 mmol/L (ref 3.5–5.2)
Sodium: 139 mmol/L (ref 134–144)
Total Protein: 7.2 g/dL (ref 6.0–8.5)

## 2017-01-22 LAB — VITAMIN D 25 HYDROXY (VIT D DEFICIENCY, FRACTURES): Vit D, 25-Hydroxy: 19 ng/mL — ABNORMAL LOW (ref 30.0–100.0)

## 2017-01-22 LAB — LIPID PANEL W/O CHOL/HDL RATIO
Cholesterol, Total: 127 mg/dL (ref 100–199)
HDL: 35 mg/dL — ABNORMAL LOW (ref >39)
LDL Calculated: 78 mg/dL (ref 0–99)
Triglycerides: 69 mg/dL (ref 0–149)
VLDL Cholesterol Cal: 14 mg/dL (ref 5–40)

## 2017-01-22 LAB — TSH: TSH: 0.762 u[IU]/mL (ref 0.450–4.500)

## 2017-01-23 ENCOUNTER — Telehealth: Payer: Self-pay

## 2017-01-23 MED ORDER — VITAMIN D (ERGOCALCIFEROL) 1.25 MG (50000 UNIT) PO CAPS: 50000.0000 [IU] | ORAL_CAPSULE | ORAL | 0 refills | Status: DC

## 2017-01-23 NOTE — Telephone Encounter (Signed)
Patient has been notified of lab results and a prescription for vitamin D3 50,000 units take 1 capsule once a week x12 weeks [facility] [address] per Dr. Sherryll BurgerShah, patient has been notified

## 2017-02-19 ENCOUNTER — Encounter: Payer: Self-pay | Admitting: Family Medicine

## 2017-02-19 ENCOUNTER — Ambulatory Visit
Admission: RE | Admit: 2017-02-19 | Discharge: 2017-02-19 | Disposition: A | Discharge status: Home / Self Care | Payer: Self-pay | Source: Ambulatory Visit | Attending: Family Medicine | Admitting: Family Medicine

## 2017-02-19 ENCOUNTER — Ambulatory Visit (INDEPENDENT_AMBULATORY_CARE_PROVIDER_SITE_OTHER): Payer: Self-pay | Admitting: Family Medicine

## 2017-02-19 VITALS — BP 102/68 | HR 139 | Temp 100.7°F | Resp 20 | Ht 61.0 in | Wt 102.7 lb

## 2017-02-19 DIAGNOSIS — R509 Fever, unspecified: Secondary | ICD-10-CM

## 2017-02-19 DIAGNOSIS — J189 Pneumonia, unspecified organism: Secondary | ICD-10-CM

## 2017-02-19 DIAGNOSIS — R059 Cough, unspecified: Secondary | ICD-10-CM

## 2017-02-19 DIAGNOSIS — R918 Other nonspecific abnormal finding of lung field: Secondary | ICD-10-CM | POA: Insufficient documentation

## 2017-02-19 DIAGNOSIS — Z87891 Personal history of nicotine dependence: Secondary | ICD-10-CM | POA: Insufficient documentation

## 2017-02-19 DIAGNOSIS — Z8701 Personal history of pneumonia (recurrent): Secondary | ICD-10-CM | POA: Insufficient documentation

## 2017-02-19 DIAGNOSIS — J181 Lobar pneumonia, unspecified organism: Secondary | ICD-10-CM

## 2017-02-19 DIAGNOSIS — R0602 Shortness of breath: Secondary | ICD-10-CM | POA: Insufficient documentation

## 2017-02-19 DIAGNOSIS — R05 Cough: Secondary | ICD-10-CM | POA: Insufficient documentation

## 2017-02-19 LAB — POCT INFLUENZA A/B
Influenza A, POC: NEGATIVE
Influenza B, POC: NEGATIVE

## 2017-02-19 MED ORDER — LEVOFLOXACIN 500 MG PO TABS: 500.0000 mg | ORAL_TABLET | Freq: Every day | ORAL | 0 refills | Status: DC

## 2017-02-19 MED ORDER — BENZONATATE 100 MG PO CAPS: 100.0000 mg | ORAL_CAPSULE | Freq: Three times a day (TID) | ORAL | 0 refills | Status: DC | PRN

## 2017-02-19 MED ORDER — BUDESONIDE-FORMOTEROL FUMARATE 160-4.5 MCG/ACT IN AERO: 2.0000 | INHALATION_SPRAY | Freq: Two times a day (BID) | RESPIRATORY_TRACT | 0 refills | Status: DC

## 2017-02-19 NOTE — Progress Notes (Signed)
Name: Annette Bowers   MRN: [id number]    DOB: 28-May-1986   Date:02/19/2017       Progress Note  Subjective  Chief Complaint  Chief Complaint  Patient presents with  . Cough    Onset- Suddenly on Sunday, coughing up yellow and white mucus, sob, wheezing, congested  . Fever    Fever as high as 101 F, headache and fatigue. Patient has tried Robitussin and Mucinex DM with no relief.     HPI  URI: she has a history of pneumonia times 2, by clinical diagnosis. She states that 4 days ago she developed acute onset of productive cough, nasal congestion, fatigue, headaches, wheezing, SOB with activity . She has been taking otc medication without much help. No nausea or vomiting, Tmax at home 100.1 yesterday   Patient Active Problem List   Diagnosis Date Noted  . Corn of foot 07/25/2016  . Hyperglycemia 07/25/2016  . Anxiety 06/02/2015  . Headache, migraine 06/02/2015  . Major depression, recurrent, chronic (HCC) 06/02/2015    Past Surgical History:  Procedure Laterality Date  . WISDOM TOOTH EXTRACTION  2011    Family History  Problem Relation Age of Onset  . Diabetes Father   . Diabetes Maternal Grandmother   . Heart disease Maternal Grandmother   . Diabetes Maternal Grandfather   . Lung cancer Paternal Grandmother   . Multiple sclerosis Cousin     Social History   Social History  . Marital status: Single    Spouse name: N/A  . Number of children: 1  . Years of education: N/A   Occupational History  . Not on file.   Social History Main Topics  . Smoking status: Current Every Day Smoker    Packs/day: 0.50    Years: 8.00    Types: Cigarettes  . Smokeless tobacco: Never Used  . Alcohol use No  . Drug use: No  . Sexual activity: Not on file   Other Topics Concern  . Not on file   Social History Narrative  . No narrative on file     Current Outpatient Prescriptions:  .  ALPRAZolam (XANAX) 0.5 MG tablet, Take 1 tablet (0.5 mg total) by mouth 3 (three) times  daily as needed for anxiety., Disp: 90 tablet, Rfl: 2 .  sertraline (ZOLOFT) 50 MG tablet, Take 1 tablet (50 mg total) by mouth daily., Disp: 90 tablet, Rfl: 0 .  Vitamin D, Ergocalciferol, (DRISDOL) 50000 units CAPS capsule, Take 1 capsule (50,000 Units total) by mouth once a week., Disp: 12 capsule, Rfl: 0 .  benzonatate (TESSALON) 100 MG capsule, Take 1-2 capsules (100-200 mg total) by mouth 3 (three) times daily as needed., Disp: 40 capsule, Rfl: 0 .  budesonide-formoterol (SYMBICORT) 160-4.5 MCG/ACT inhaler, Inhale 2 puffs into the lungs 2 (two) times daily., Disp: 1 Inhaler, Rfl: 0  Allergies  Allergen Reactions  . Promethazine Hcl     drowsy  . Topiramate     drowsy  . Trazodone     groggy     ROS  Ten systems reviewed and is negative except as mentioned in HPI   Objective  Vitals:   02/19/17 1436  BP: 102/68  Pulse: (!) 139  Resp: 20  Temp: (!) 100.7 F (38.2 C)  TempSrc: Oral  SpO2: 98%  Weight: 102 lb 11.2 oz (46.6 kg)  Height: 5\' 1"  (1.549 m)    Body mass index is 19.4 kg/m.  Physical Exam  Constitutional: Patient appears well-developed and well-nourished.  No distress.  HEENT: head atraumatic, normocephalic, pupils equal and reactive to light, ears normal bilaterally, boggy turbinates, neck supple, throat within normal limits Cardiovascular: Normal rate, regular rhythm and normal heart sounds.  No murmur heard. No BLE edema. Pulmonary/Chest: Effort normal and bilateral rhonchi, no wheezing Abdominal: Soft.  There is no tenderness. Psychiatric: Patient has a normal mood and affect. behavior is normal. Judgment and thought content normal.  Recent Results (from the past 2160 hour(s))  POCT glycosylated hemoglobin (Hb A1C)     Status: Normal   Collection Time: 01/21/17  9:29 AM  Result Value Ref Range   Hemoglobin A1C 5.3   CBC with Differential/Platelet     Status: None   Collection Time: 01/21/17  9:30 AM  Result Value Ref Range   WBC 9.4 3.4 - 10.8  x10E3/uL   RBC 4.40 3.77 - 5.28 x10E6/uL   Hemoglobin 13.9 11.1 - 15.9 g/dL   Hematocrit 09.839.6 11.934.0 - 46.6 %   MCV 90 79 - 97 fL   MCH 31.6 26.6 - 33.0 pg   MCHC 35.1 31.5 - 35.7 g/dL   RDW 14.712.3 82.912.3 - 56.215.4 %   Platelets 223 150 - 379 x10E3/uL   Neutrophils 72 Not Estab. %   Lymphs 19 Not Estab. %   Monocytes 6 Not Estab. %   Eos 3 Not Estab. %   Basos 0 Not Estab. %   Neutrophils Absolute 6.7 1.4 - 7.0 x10E3/uL   Lymphocytes Absolute 1.8 0.7 - 3.1 x10E3/uL   Monocytes Absolute 0.6 0.1 - 0.9 x10E3/uL   EOS (ABSOLUTE) 0.3 0.0 - 0.4 x10E3/uL   Basophils Absolute 0.0 0.0 - 0.2 x10E3/uL   Immature Granulocytes 0 Not Estab. %   Immature Grans (Abs) 0.0 0.0 - 0.1 x10E3/uL  Comprehensive metabolic panel     Status: None   Collection Time: 01/21/17  9:30 AM  Result Value Ref Range   Glucose 97 65 - 99 mg/dL   BUN 9 6 - 20 mg/dL   Creatinine, Ser 1.300.63 0.57 - 1.00 mg/dL   GFR calc non Af Amer 122 >59 mL/min/1.73   GFR calc Af Amer 141 >59 mL/min/1.73   BUN/Creatinine Ratio 14 9 - 23   Sodium 139 134 - 144 mmol/L   Potassium 4.4 3.5 - 5.2 mmol/L   Chloride 100 96 - 106 mmol/L   CO2 24 18 - 29 mmol/L   Calcium 8.9 8.7 - 10.2 mg/dL   Total Protein 7.2 6.0 - 8.5 g/dL   Albumin 4.6 3.5 - 5.5 g/dL   Globulin, Total 2.6 1.5 - 4.5 g/dL   Albumin/Globulin Ratio 1.8 1.2 - 2.2   Bilirubin Total 0.3 0.0 - 1.2 mg/dL   Alkaline Phosphatase 46 39 - 117 IU/L   AST 14 0 - 40 IU/L   ALT 12 0 - 32 IU/L  Lipid Panel w/o Chol/HDL Ratio     Status: Abnormal   Collection Time: 01/21/17  9:30 AM  Result Value Ref Range   Cholesterol, Total 127 100 - 199 mg/dL   Triglycerides 69 0 - 149 mg/dL   HDL 35 (L) >86>39 mg/dL   VLDL Cholesterol Cal 14 5 - 40 mg/dL   LDL Calculated 78 0 - 99 mg/dL  TSH     Status: None   Collection Time: 01/21/17  9:30 AM  Result Value Ref Range   TSH 0.762 0.450 - 4.500 uIU/mL  VITAMIN D 25 Hydroxy (Vit-D Deficiency, Fractures)     Status: Abnormal  Collection Time:  01/21/17  9:30 AM  Result Value Ref Range   Vit D, 25-Hydroxy 19.0 (L) 30.0 - 100.0 ng/mL    Comment: Vitamin D deficiency has been defined by the Institute of Medicine and an Endocrine Society practice guideline as a level of serum 25-OH vitamin D less than 20 ng/mL (1,2). The Endocrine Society went on to further define vitamin D insufficiency as a level between 21 and 29 ng/mL (2). 1. IOM (Institute of Medicine). 2010. Dietary reference    intakes for calcium and D. [place]: The    [employer]. 2. Holick MF, Binkley [place], Bischoff-Ferrari HA, et al.    Evaluation, treatment, and prevention of vitamin D    deficiency: an Endocrine Society clinical practice    guideline. JCEM. 2011 Jul; 96(7):1911-30.   POCT Influenza A/B     Status: Normal   Collection Time: 02/19/17  2:40 PM  Result Value Ref Range   Influenza A, POC Negative Negative   Influenza B, POC Negative Negative     PHQ2/9: Depression screen [facility] 2/9 01/21/2017 10/24/2016 06/13/2016 05/21/2016 04/18/2016  Decreased Interest 0 0 0 0 0  Down, Depressed, Hopeless 0 0 1 1 1   PHQ - 2 Score 0 0 1 1 1      Fall Risk: Fall Risk  01/21/2017 10/24/2016 06/13/2016 05/21/2016 04/18/2016  Falls in the past year? No No No No No      Assessment & Plan  1. Fever and chills  - POCT Influenza A/B  2. Cough  - POCT Influenza A/B - DG Chest 2 View; Future - benzonatate (TESSALON) 100 MG capsule; Take 1-2 capsules (100-200 mg total) by mouth 3 (three) times daily as needed.  Dispense: 40 capsule; Refill: 0 - budesonide-formoterol (SYMBICORT) 160-4.5 MCG/ACT inhaler; Inhale 2 puffs into the lungs 2 (two) times daily.  Dispense: 1 Inhaler; Refill: 0  3. History of pneumonia  - DG Chest 2 View; Future   4. Pneumonia of right middle lobe due to infectious organism (HCC)  - levofloxacin (LEVAQUIN) 500 MG tablet; Take 1 tablet (500 mg total) by mouth daily.  Dispense: 7 tablet; Refill: 0

## 2017-02-25 ENCOUNTER — Encounter: Payer: Self-pay | Admitting: Family Medicine

## 2017-02-25 ENCOUNTER — Ambulatory Visit (INDEPENDENT_AMBULATORY_CARE_PROVIDER_SITE_OTHER): Payer: Self-pay | Admitting: Family Medicine

## 2017-02-25 VITALS — BP 100/68 | HR 90 | Temp 98.0°F | Resp 16 | Ht 61.0 in | Wt 100.7 lb

## 2017-02-25 DIAGNOSIS — J181 Lobar pneumonia, unspecified organism: Secondary | ICD-10-CM

## 2017-02-25 DIAGNOSIS — J189 Pneumonia, unspecified organism: Secondary | ICD-10-CM

## 2017-02-25 NOTE — Progress Notes (Signed)
Name: Annette Bowers   MRN: [id number]    DOB: 31-Dec-1985   Date:02/25/2017       Progress Note  Subjective  Chief Complaint  Chief Complaint  Patient presents with  . Pneumonia    1 week follow up    Pneumonia  She complains of cough, difficulty breathing and sputum production. This is a recurrent problem. The cough is productive. Associated symptoms include chest pain (from coughing) and malaise/fatigue. Pertinent negatives include no fever. Her symptoms are alleviated by beta-agonist and steroid inhaler. Her past medical history is significant for pneumonia.     Past Medical History:  Diagnosis Date  . Anxiety   . Insomnia   . Migraine   . Moderate major depression (HCC)     Past Surgical History:  Procedure Laterality Date  . WISDOM TOOTH EXTRACTION  2011    Family History  Problem Relation Age of Onset  . Diabetes Father   . Diabetes Maternal Grandmother   . Heart disease Maternal Grandmother   . Diabetes Maternal Grandfather   . Lung cancer Paternal Grandmother   . Multiple sclerosis Cousin     Social History   Social History  . Marital status: Single    Spouse name: N/A  . Number of children: 1  . Years of education: N/A   Occupational History  . Not on file.   Social History Main Topics  . Smoking status: Current Every Day Smoker    Packs/day: 0.50    Years: 8.00    Types: Cigarettes  . Smokeless tobacco: Never Used  . Alcohol use No  . Drug use: No  . Sexual activity: Not on file   Other Topics Concern  . Not on file   Social History Narrative  . No narrative on file     Current Outpatient Prescriptions:  .  ALPRAZolam (XANAX) 0.5 MG tablet, Take 1 tablet (0.5 mg total) by mouth 3 (three) times daily as needed for anxiety., Disp: 90 tablet, Rfl: 2 .  benzonatate (TESSALON) 100 MG capsule, Take 1-2 capsules (100-200 mg total) by mouth 3 (three) times daily as needed., Disp: 40 capsule, Rfl: 0 .  budesonide-formoterol (SYMBICORT) 160-4.5  MCG/ACT inhaler, Inhale 2 puffs into the lungs 2 (two) times daily., Disp: 1 Inhaler, Rfl: 0 .  sertraline (ZOLOFT) 50 MG tablet, Take 1 tablet (50 mg total) by mouth daily., Disp: 90 tablet, Rfl: 0 .  Vitamin D, Ergocalciferol, (DRISDOL) 50000 units CAPS capsule, Take 1 capsule (50,000 Units total) by mouth once a week., Disp: 12 capsule, Rfl: 0  Allergies  Allergen Reactions  . Promethazine Hcl     drowsy  . Topiramate     drowsy  . Trazodone     groggy     Review of Systems  Constitutional: Positive for malaise/fatigue. Negative for chills and fever.  Eyes: Negative for blurred vision and double vision.  Respiratory: Positive for cough and sputum production.   Cardiovascular: Positive for chest pain (from coughing).  Gastrointestinal: Negative for abdominal pain, nausea and vomiting.      Objective  Vitals:   02/25/17 1343  BP: 100/68  Pulse: 90  Resp: 16  Temp: 98 F (36.7 C)  TempSrc: Oral  SpO2: 97%  Weight: 100 lb 11.2 oz (45.7 kg)  Height: 5\' 1"  (1.549 m)    Physical Exam  Constitutional: She is oriented to person, place, and time and well-developed, well-nourished, and in no distress.  HENT:  Head: Normocephalic and atraumatic.  Mouth/Throat:  No posterior oropharyngeal erythema.  Cardiovascular: Normal rate and regular rhythm.   No murmur heard. Pulmonary/Chest: Effort normal and breath sounds normal. No respiratory distress. She has no wheezes. She has no rales.  Abdominal: Soft. Bowel sounds are normal. There is no tenderness.  Neurological: She is alert and oriented to person, place, and time.  Nursing note and vitals reviewed.     Assessment & Plan  1. Community acquired pneumonia of right middle lobe of lung (HCC) Appears to be resolving, still has considerable coughing and advised to continue to take Tessalon, obtain CXR to ensure radiological resolution - DG Chest 2 View; Future   Lenzie Sandler Asad A. Faylene KurtzShah [facility] 02/25/2017 2:00 PM

## 2017-04-17 ENCOUNTER — Other Ambulatory Visit: Payer: Self-pay | Admitting: Family Medicine

## 2017-04-17 ENCOUNTER — Telehealth: Payer: Self-pay | Admitting: Family Medicine

## 2017-04-17 DIAGNOSIS — R05 Cough: Secondary | ICD-10-CM

## 2017-04-17 DIAGNOSIS — R059 Cough, unspecified: Secondary | ICD-10-CM

## 2017-04-17 DIAGNOSIS — F339 Major depressive disorder, recurrent, unspecified: Secondary | ICD-10-CM

## 2017-04-18 NOTE — Telephone Encounter (Signed)
Rx approved

## 2017-04-18 NOTE — Telephone Encounter (Signed)
Pt already have appt for Monday 04/21/17 with Dr Sherryll BurgerShah

## 2017-04-21 ENCOUNTER — Ambulatory Visit: Payer: Self-pay | Admitting: Family Medicine

## 2017-04-22 ENCOUNTER — Encounter: Payer: Self-pay | Admitting: Family Medicine

## 2017-04-22 ENCOUNTER — Ambulatory Visit (INDEPENDENT_AMBULATORY_CARE_PROVIDER_SITE_OTHER): Payer: Self-pay | Admitting: Family Medicine

## 2017-04-22 ENCOUNTER — Ambulatory Visit: Payer: Self-pay | Admitting: Family Medicine

## 2017-04-22 VITALS — BP 100/66 | HR 76 | Temp 97.3°F | Resp 16 | Ht 61.0 in | Wt 97.6 lb

## 2017-04-22 DIAGNOSIS — F419 Anxiety disorder, unspecified: Secondary | ICD-10-CM

## 2017-04-22 DIAGNOSIS — F339 Major depressive disorder, recurrent, unspecified: Secondary | ICD-10-CM

## 2017-04-22 MED ORDER — ALPRAZOLAM 0.5 MG PO TABS: 0.5000 mg | ORAL_TABLET | Freq: Three times a day (TID) | ORAL | 2 refills | Status: DC | PRN

## 2017-04-22 NOTE — Progress Notes (Signed)
Name: Annette Bowers   MRN: [id number]    DOB: October 21, 1986   Date:04/22/2017       Progress Note  Subjective  Chief Complaint  Chief Complaint  Patient presents with  . Medication Refill    Anxiety  Presents for follow-up visit. Symptoms include depressed mood. Patient reports no chest pain, excessive worry, insomnia, nausea, nervous/anxious behavior, panic, restlessness or shortness of breath. The severity of symptoms is moderate and causing significant distress.    Depression         This is a chronic problem.The problem is unchanged.  Associated symptoms include fatigue, irritable, headaches and sad.  Associated symptoms include no helplessness, does not have insomnia, no restlessness and no myalgias.  Past treatments include SSRIs - Selective serotonin reuptake inhibitors.  Past medical history includes anxiety.     Pertinent negatives include no fibromyalgia.   Past Medical History:  Diagnosis Date  . Anxiety   . Insomnia   . Migraine   . Moderate major depression (HCC)     Past Surgical History:  Procedure Laterality Date  . WISDOM TOOTH EXTRACTION  2011    Family History  Problem Relation Age of Onset  . Diabetes Father   . Diabetes Maternal Grandmother   . Heart disease Maternal Grandmother   . Diabetes Maternal Grandfather   . Lung cancer Paternal Grandmother   . Multiple sclerosis Cousin     Social History   Social History  . Marital status: Single    Spouse name: N/A  . Number of children: 1  . Years of education: N/A   Occupational History  . Not on file.   Social History Main Topics  . Smoking status: Current Every Day Smoker    Packs/day: 0.50    Years: 8.00    Types: Cigarettes  . Smokeless tobacco: Never Used  . Alcohol use No  . Drug use: No  . Sexual activity: Not on file   Other Topics Concern  . Not on file   Social History Narrative  . No narrative on file     Current Outpatient Prescriptions:  .  ALPRAZolam (XANAX) 0.5 MG  tablet, Take 1 tablet (0.5 mg total) by mouth 3 (three) times daily as needed for anxiety., Disp: 90 tablet, Rfl: 2 .  budesonide-formoterol (SYMBICORT) 160-4.5 MCG/ACT inhaler, Inhale 2 puffs into the lungs 2 (two) times daily., Disp: 1 Inhaler, Rfl: 0 .  sertraline (ZOLOFT) 50 MG tablet, take 1 tablet by mouth once daily, Disp: 90 tablet, Rfl: 0 .  benzonatate (TESSALON) 100 MG capsule, Take 1-2 capsules (100-200 mg total) by mouth 3 (three) times daily as needed. (Patient not taking: Reported on 04/22/2017), Disp: 40 capsule, Rfl: 0 .  Vitamin D, Ergocalciferol, (DRISDOL) 50000 units CAPS capsule, Take 1 capsule (50,000 Units total) by mouth once a week. (Patient not taking: Reported on 04/22/2017), Disp: 12 capsule, Rfl: 0  Allergies  Allergen Reactions  . Promethazine Hcl     drowsy  . Topiramate     drowsy  . Trazodone     groggy     Review of Systems  Constitutional: Positive for fatigue.  Respiratory: Negative for shortness of breath.   Cardiovascular: Negative for chest pain.  Gastrointestinal: Negative for nausea.  Musculoskeletal: Negative for myalgias.  Neurological: Positive for headaches.  Psychiatric/Behavioral: Positive for depression. The patient is not nervous/anxious and does not have insomnia.       Objective  Vitals:   04/22/17 1457  BP: 100/66  Pulse: 76  Resp: 16  Temp: 97.3 F (36.3 C)  TempSrc: Oral  SpO2: 97%  Weight: 97 lb 9.6 oz (44.3 kg)  Height: 5\' 1"  (1.549 m)    Physical Exam  Constitutional: She is oriented to person, place, and time and well-developed, well-nourished, and in no distress. She is irritable.  HENT:  Head: Normocephalic and atraumatic.  Mouth/Throat: No posterior oropharyngeal erythema.  Cardiovascular: Normal rate and regular rhythm.   No murmur heard. Pulmonary/Chest: Effort normal and breath sounds normal. No respiratory distress. She has no wheezes. She has no rales.  Abdominal: Soft. Bowel sounds are normal. There  is no tenderness.  Neurological: She is alert and oriented to person, place, and time.  Nursing note and vitals reviewed.      Recent Results (from the past 2160 hour(s))  POCT Influenza A/B     Status: Normal   Collection Time: 02/19/17  2:40 PM  Result Value Ref Range   Influenza A, POC Negative Negative   Influenza B, POC Negative Negative     Assessment & Plan  1. Anxiety Stable and responsive to alprazolam taken up to 3 times daily when needed, patient compliant with controlled substances agreement.  controlled substances Registry was reviewed prior to providing the prescription - ALPRAZolam (XANAX) 0.5 MG tablet; Take 1 tablet (0.5 mg total) by mouth 3 (three) times daily as needed for anxiety.  Dispense: 90 tablet; Refill: 2  2. Major depression, recurrent, chronic (HCC) Dr. Sherie Don has authorized a refill for sertraline while I was away, continue on present therapy.   Annette Bowers [facility]ad [facility] 04/22/2017 3:24 PM

## 2017-07-08 ENCOUNTER — Ambulatory Visit (INDEPENDENT_AMBULATORY_CARE_PROVIDER_SITE_OTHER): Payer: Self-pay | Admitting: Family Medicine

## 2017-07-08 ENCOUNTER — Encounter: Payer: Self-pay | Admitting: Family Medicine

## 2017-07-08 VITALS — BP 103/67 | HR 120 | Temp 98.0°F | Resp 17 | Ht 61.0 in | Wt 95.5 lb

## 2017-07-08 DIAGNOSIS — F419 Anxiety disorder, unspecified: Secondary | ICD-10-CM

## 2017-07-08 DIAGNOSIS — E559 Vitamin D deficiency, unspecified: Secondary | ICD-10-CM | POA: Insufficient documentation

## 2017-07-08 DIAGNOSIS — F339 Major depressive disorder, recurrent, unspecified: Secondary | ICD-10-CM

## 2017-07-08 MED ORDER — BUSPIRONE HCL 7.5 MG PO TABS: 7.5000 mg | ORAL_TABLET | Freq: Two times a day (BID) | ORAL | 0 refills | Status: DC

## 2017-07-08 NOTE — Progress Notes (Signed)
Name: Annette Bowers   MRN: [id number]    DOB: 1986-09-13   Date:07/08/2017       Progress Note  Subjective  Chief Complaint  Chief Complaint  Patient presents with  . Follow-up    3 mo  . Medication Refill    Anxiety  Presents for follow-up visit. Symptoms include depressed mood, excessive worry, feeling of choking, nervous/anxious behavior and panic. Patient reports no decreased concentration. The severity of symptoms is moderate and causing significant distress. The quality of sleep is fair.    Depression         This is a chronic problem.  The onset quality is gradual.   Associated symptoms include no decreased concentration, no fatigue, no appetite change, no headaches and not sad.  Past treatments include SSRIs - Selective serotonin reuptake inhibitors.  Past medical history includes anxiety.    Patient reports that alprazolam makes her very drowsy and sleepy and she cannot afford to experience such side effects given she works full-time and takes care of her child, she is asking if there is a different anxiolytic which does not have drowsiness and somnolence as the main side effect   Past Medical History:  Diagnosis Date  . Anxiety   . Insomnia   . Migraine   . Moderate major depression (HCC)     Past Surgical History:  Procedure Laterality Date  . WISDOM TOOTH EXTRACTION  2011    Family History  Problem Relation Age of Onset  . Diabetes Father   . Diabetes Maternal Grandmother   . Heart disease Maternal Grandmother   . Diabetes Maternal Grandfather   . Lung cancer Paternal Grandmother   . Multiple sclerosis Cousin     Social History   Social History  . Marital status: Single    Spouse name: N/A  . Number of children: 1  . Years of education: N/A   Occupational History  . Not on file.   Social History Main Topics  . Smoking status: Current Every Day Smoker    Packs/day: 0.50    Years: 8.00    Types: Cigarettes  . Smokeless tobacco: Never Used  .  Alcohol use No  . Drug use: No  . Sexual activity: Not on file   Other Topics Concern  . Not on file   Social History Narrative  . No narrative on file     Current Outpatient Prescriptions:  .  ALPRAZolam (XANAX) 0.5 MG tablet, Take 1 tablet (0.5 mg total) by mouth 3 (three) times daily as needed for anxiety., Disp: 90 tablet, Rfl: 2 .  budesonide-formoterol (SYMBICORT) 160-4.5 MCG/ACT inhaler, Inhale 2 puffs into the lungs 2 (two) times daily., Disp: 1 Inhaler, Rfl: 0 .  sertraline (ZOLOFT) 50 MG tablet, take 1 tablet by mouth once daily, Disp: 90 tablet, Rfl: 0 .  Vitamin D, Ergocalciferol, (DRISDOL) 50000 units CAPS capsule, Take 1 capsule (50,000 Units total) by mouth once a week., Disp: 12 capsule, Rfl: 0 .  benzonatate (TESSALON) 100 MG capsule, Take 1-2 capsules (100-200 mg total) by mouth 3 (three) times daily as needed. (Patient not taking: Reported on 04/22/2017), Disp: 40 capsule, Rfl: 0  Allergies  Allergen Reactions  . Promethazine Hcl     drowsy  . Topiramate     drowsy  . Trazodone     groggy     Review of Systems  Constitutional: Negative for appetite change and fatigue.  Neurological: Negative for headaches.  Psychiatric/Behavioral: Positive for depression. Negative for  decreased concentration. The patient is nervous/anxious.       Objective  Vitals:   07/08/17 1559  BP: 103/67  Pulse: (!) 120  Resp: 17  Temp: 98 F (36.7 C)  TempSrc: Oral  SpO2: 99%  Weight: 95 lb 8 oz (43.3 kg)  Height: 5\' 1"  (1.549 m)    Physical Exam  Constitutional: She is oriented to person, place, and time and well-developed, well-nourished, and in no distress.  HENT:  Head: Normocephalic and atraumatic.  Cardiovascular: Normal rate, regular rhythm and normal heart sounds.   No murmur heard. Pulmonary/Chest: Effort normal and breath sounds normal.  Neurological: She is alert and oriented to person, place, and time.  Psychiatric: Mood, memory, affect and judgment  normal.  Nursing note and vitals reviewed.     Assessment & Plan  1. Anxiety Patient has taken only 1 tablet of alprazolam 0.5 mg in the last few days, will DC without taper, start on buspirone 7.5 mg twice a day, asked to follow up if she experiences any side effects - busPIRone (BUSPAR) 7.5 MG tablet; Take 1 tablet (7.5 mg total) by mouth 2 (two) times daily.  Dispense: 180 tablet; Refill: 0  2. Major depression, recurrent, chronic (HCC) [place], continue on Sertraline  3. Vitamin D deficiency Obtain levels after completion of vitamin D - VITAMIN D 25 Hydroxy (Vit-D Deficiency, Fractures)   Lydian Chavous Asad A. [facility]lene [facility]zSh[facility] 07/08/2017 4:20 PM

## 2017-07-09 LAB — VITAMIN D 25 HYDROXY (VIT D DEFICIENCY, FRACTURES): VIT D 25 HYDROXY: 40.3 ng/mL (ref 30.0–100.0)

## 2017-07-10 ENCOUNTER — Encounter: Payer: Self-pay | Admitting: Family Medicine

## 2017-07-10 ENCOUNTER — Telehealth: Payer: Self-pay | Admitting: Family Medicine

## 2017-07-10 ENCOUNTER — Other Ambulatory Visit: Payer: Self-pay | Admitting: Family Medicine

## 2017-07-10 DIAGNOSIS — F339 Major depressive disorder, recurrent, unspecified: Secondary | ICD-10-CM

## 2017-07-10 DIAGNOSIS — F419 Anxiety disorder, unspecified: Secondary | ICD-10-CM

## 2017-07-10 MED ORDER — ALPRAZOLAM 0.5 MG PO TABS: 0.5000 mg | ORAL_TABLET | Freq: Three times a day (TID) | ORAL | 0 refills | Status: DC | PRN

## 2017-07-10 NOTE — Telephone Encounter (Signed)
Spoke with patient and she reports that buspirone was too expensive for her to be able to afford, she has not been on any anxiolytic in the last 2-3 days and feeling really anxious and nervous, she has taken Seroquel in the past which was making her drowsy, other non-benzodiazepine anxiolytics have similar side effect of drowsiness such as with Atarax. We will start back on alprazolam 0.5 mg 3 times a day when necessary for anxiety, continue on sertraline for depression, reassess in one month, prescription ready for pickup. Patient verbalized agreement with plan

## 2017-07-10 NOTE — Telephone Encounter (Signed)
Dr. Sherryll BurgerShah, I spoke with patient and she is unable to afford the cost of the new medication prescribed and she would like to know if she can go back on Alprazolam she needs something she has been out for two days now, please refill medication.

## 2017-07-10 NOTE — Telephone Encounter (Signed)
PT NEEDS A CALL BACK ABOUT HER MEDICATION. SAID THAT YOU HAD TOLD HER TO CALL IF SHE EVER NEEDED TO. SHE WAS VERY UPSET NOT MAD BUT SOUNDED LIKE IN REARS.

## 2017-07-11 NOTE — Telephone Encounter (Signed)
Errenous °

## 2017-08-04 ENCOUNTER — Telehealth: Payer: Self-pay | Admitting: Family Medicine

## 2017-08-04 DIAGNOSIS — F419 Anxiety disorder, unspecified: Secondary | ICD-10-CM

## 2017-08-04 NOTE — Telephone Encounter (Signed)
Pt had appointment for august but has to cancel and reschedule for december due to [facility] sending her to collection (in which she pays monthly). She cannot afford to come in every month and would like to know if you can please write her a script for alprazolam to last until her December appt. To discuss further pt can be reached at [phone]

## 2017-08-04 NOTE — Telephone Encounter (Signed)
Dr. Sherryll Burger, Pt had appointment for august but has to cancel and reschedule for december due to [facility] sending her to collection (in which she pays monthly). She cannot afford to come in every month and would like to know if you can please write her a script for alprazolam to last until her December appt. To discuss further pt can be reached at [phone]

## 2017-08-05 MED ORDER — ALPRAZOLAM 0.5 MG PO TABS: 0.5000 mg | ORAL_TABLET | Freq: Three times a day (TID) | ORAL | 2 refills | Status: AC | PRN

## 2017-08-05 NOTE — Telephone Encounter (Signed)
Returned will call and discussed patient's prescription for alprazolam, she is on alprazolam 0.5 mg by mouth 3 times a day, last refilled on 07/10/2017 for one month. We'll prescribe a 3 month supply for patient for the month of September, October, November and she will return in December for scheduled follow-up. Patient verbalized agreement with plan. Prescription is ready for pickup

## 2017-08-06 NOTE — Telephone Encounter (Signed)
Patient has been notified that prescription is ready for pickup 

## 2017-08-07 ENCOUNTER — Ambulatory Visit
Admission: RE | Admit: 2017-08-07 | Discharge: 2017-08-07 | Disposition: A | Discharge status: Home / Self Care | Payer: Self-pay | Source: Ambulatory Visit | Attending: Family Medicine | Admitting: Family Medicine

## 2017-08-07 ENCOUNTER — Ambulatory Visit: Payer: Self-pay | Admitting: Family Medicine

## 2017-08-07 ENCOUNTER — Ambulatory Visit (INDEPENDENT_AMBULATORY_CARE_PROVIDER_SITE_OTHER): Payer: Self-pay | Admitting: Family Medicine

## 2017-08-07 ENCOUNTER — Encounter: Payer: Self-pay | Admitting: Family Medicine

## 2017-08-07 ENCOUNTER — Other Ambulatory Visit: Payer: Self-pay | Admitting: Family Medicine

## 2017-08-07 VITALS — BP 102/75 | HR 100 | Temp 98.7°F | Resp 16 | Ht 61.0 in | Wt 92.5 lb

## 2017-08-07 DIAGNOSIS — R059 Cough, unspecified: Secondary | ICD-10-CM

## 2017-08-07 DIAGNOSIS — R0602 Shortness of breath: Secondary | ICD-10-CM

## 2017-08-07 DIAGNOSIS — Z720 Tobacco use: Secondary | ICD-10-CM | POA: Insufficient documentation

## 2017-08-07 DIAGNOSIS — R062 Wheezing: Secondary | ICD-10-CM | POA: Insufficient documentation

## 2017-08-07 DIAGNOSIS — R05 Cough: Secondary | ICD-10-CM | POA: Insufficient documentation

## 2017-08-07 DIAGNOSIS — J181 Lobar pneumonia, unspecified organism: Principal | ICD-10-CM

## 2017-08-07 DIAGNOSIS — J189 Pneumonia, unspecified organism: Secondary | ICD-10-CM

## 2017-08-07 MED ORDER — LEVOFLOXACIN 500 MG PO TABS
500.0000 mg | ORAL_TABLET | Freq: Every day | ORAL | 0 refills | Status: DC
Start: 2017-08-07 — End: 2017-10-20

## 2017-08-07 MED ORDER — BENZONATATE 100 MG PO CAPS: 100.0000 mg | ORAL_CAPSULE | Freq: Three times a day (TID) | ORAL | 0 refills | Status: DC | PRN

## 2017-08-07 NOTE — Progress Notes (Addendum)
Name: Annette Bowers   MRN: [id number]    DOB: August 24, 1986   Date:08/07/2017       Progress Note  Subjective  Chief Complaint  Chief Complaint  Patient presents with  . Fever    100.2 on yesterday  . Generalized Body Aches  . Cough    HPI  - Pt presents with 2 day history of fever, chills, sweating, body aches, productive cough with thick mucus, mild nasal congestion, difficulty taking deep breath due to tightness on inspiration.  Has been around recent sick contacts with similar illness.  No ear pain/pressure, sinus pain/pressure, no shortness of breath, NVD, or sore throat.  Took Advil this morning for fevers and body aches and this helped.  - Tobacco Use: Patient is current 1/2-3/4 ppd smoker. Last Xray in March 2018 showed PNA and possible COPD/Reactive airway disease. She is ready to quit, but does not have insurance and is concerned about cost of medications - she will try the [place] quit line and working with family for support.  Patient Active Problem List   Diagnosis Date Noted  . Vitamin D deficiency 07/08/2017  . Corn of foot 07/25/2016  . Hyperglycemia 07/25/2016  . Anxiety 06/02/2015  . Headache, migraine 06/02/2015  . Major depression, recurrent, chronic (HCC) 06/02/2015    Social History  Substance Use Topics  . Smoking status: Current Every Day Smoker    Packs/day: 0.50    Years: 8.00    Types: Cigarettes  . Smokeless tobacco: Never Used  . Alcohol use No     Current Outpatient Prescriptions:  .  ALPRAZolam (XANAX) 0.5 MG tablet, Take 1 tablet (0.5 mg total) by mouth 3 (three) times daily as needed for anxiety., Disp: 90 tablet, Rfl: 2 .  budesonide-formoterol (SYMBICORT) 160-4.5 MCG/ACT inhaler, Inhale 2 puffs into the lungs 2 (two) times daily., Disp: 1 Inhaler, Rfl: 0 .  sertraline (ZOLOFT) 50 MG tablet, TAKE 1 TABLET BY MOUTH EVERY DAY, Disp: 90 tablet, Rfl: 0 .  benzonatate (TESSALON) 100 MG capsule, Take 1-2 capsules (100-200 mg total) by mouth 3 (three)  times daily as needed. (Patient not taking: Reported on 04/22/2017), Disp: 40 capsule, Rfl: 0 .  busPIRone (BUSPAR) 7.5 MG tablet, Take 1 tablet (7.5 mg total) by mouth 2 (two) times daily. (Patient not taking: Reported on 08/07/2017), Disp: 180 tablet, Rfl: 0 .  Vitamin D, Ergocalciferol, (DRISDOL) 50000 units CAPS capsule, Take 1 capsule (50,000 Units total) by mouth once a week. (Patient not taking: Reported on 08/07/2017), Disp: 12 capsule, Rfl: 0  Allergies  Allergen Reactions  . Promethazine Hcl     drowsy  . Topiramate     drowsy  . Trazodone     groggy    ROS  Ten systems reviewed and is negative except as mentioned in HPI.  Objective  Vitals:   08/07/17 1039  BP: 102/75  Pulse: 100  Resp: 16  Temp: 98.7 F (37.1 C)  TempSrc: Oral  SpO2: 93%  Weight: 92 lb 8 oz (42 kg)  Height: 5\' 1"  (1.549 m)   Body mass index is 17.48 kg/m.  Nursing Note and Vital Signs reviewed.  Physical Exam  Constitutional: Patient appears well-developed and well-nourished. Thin, in no distress.  HEENT: head atraumatic, normocephalic, pupils equal and reactive to light, EOM's intact, TM's without erythema or bulging, no maxillary or frontal sinus pain on palpation, neck supple with mild LEFT sided lymphadenopathy, oropharynx mildly erythematous and moist without exudate Cardiovascular: Normal rate, regular rhythm, S1/S2  present.  No murmur or rub heard. No BLE edema. Pulmonary/Chest: Effort normal and breath sounds slightly diminished throughout and LUL has expiratory wheezing. No respiratory distress or retractions. Psychiatric: Patient has a normal mood and affect. behavior is normal. Judgment and thought content normal.  Recent Results (from the past 2160 hour(s))  Vitamin D (25 hydroxy)     Status: None   Collection Time: 07/08/17  4:48 PM  Result Value Ref Range   Vit D, 25-Hydroxy 40.3 30.0 - 100.0 ng/mL    Comment: Vitamin D deficiency has been defined by the Institute of Medicine  and an Endocrine Society practice guideline as a level of serum 25-OH vitamin D less than 20 ng/mL (1,2). The Endocrine Society went on to further define vitamin D insufficiency as a level between 21 and 29 ng/mL (2). 1. IOM (Institute of Medicine). 2010. Dietary reference    intakes for calcium and D. [place]: The    [employer]. 2. Holick MF, Binkley , Bischoff-Ferrari HA, et al.    Evaluation, treatment, and prevention of vitamin D    deficiency: an Endocrine Society clinical practice    guideline. JCEM. 2011 Jul; 96(7):1911-30.      Assessment & Plan  1. Cough - benzonatate (TESSALON PERLES) 100 MG capsule; Take 1 capsule (100 mg total) by mouth 3 (three) times daily as needed for cough.  Dispense: 20 capsule; Refill: 0 - DG Chest 2 View; Future  2. Wheezing - DG Chest 2 View; Future  3. Shortness of breath - DG Chest 2 View; Future  4. Tobacco abuse - Patient is ready to quit, counseling is provided. Patient declines medication or patches today, but will call the quit line for further information.  She is congratulated on this decision and is told that we are here if she needs anything to help. - DG Chest 2 View; Future  Question COPD flare vs Pneumonia vs viral respiratory infection - will obtain CXR to differentiate. Discussed these possibilities with patient and the need to return in about 2 weeks once feeling better for a follow up with PCP for spirometry testing to confirm COPD diagnosis.  -Red flags and when to present for emergency care or RTC including fever >101.98F, chest pain, shortness of breath, new/worsening/un-resolving symptoms reviewed with patient at time of visit. Follow up and care instructions discussed and provided in AVS.  I have reviewed this encounter including the documentation in this note and/or discussed this patient with the Deboraha Sprangprovider,Ankit Degregorio, FNP, NP-C. I am certifying that I agree with the content of this note as  supervising physician.  Alba CoryKrichna Sowles, MD [facility] 08/07/2017, 1:11 PM

## 2017-08-07 NOTE — Patient Instructions (Addendum)
-   1-800-QUIT-NOW [phone]). - Use humidifier when sleeping/resting, take Advil and/or Tylenol PRN for fever and body aches.

## 2017-08-07 NOTE — Addendum Note (Signed)
Addended by: Alba CorySOWLES, Ndrew Creason F on: 08/07/2017 01:12 PM   Modules accepted: Level of Service

## 2017-08-13 ENCOUNTER — Telehealth: Payer: Self-pay | Admitting: Family Medicine

## 2017-08-19 NOTE — Telephone Encounter (Signed)
erroneous

## 2017-08-27 ENCOUNTER — Telehealth: Payer: Self-pay | Admitting: Family Medicine

## 2017-08-27 NOTE — Telephone Encounter (Signed)
-----   Message from Doren Custard, FNP sent at 08/07/2017  2:12 PM EDT ----- Please call patient and ask her to go in to have her repeat chest Xray performed in the next few days.  Please also ensure that she has a follow up scheduled for after her CXR is done so that we may discuss the results during her appointment. Thanks!

## 2017-08-28 NOTE — Telephone Encounter (Signed)
Left message for patient to go for xray,

## 2017-09-01 ENCOUNTER — Ambulatory Visit: Payer: Self-pay | Admitting: Family Medicine

## 2017-09-01 ENCOUNTER — Telehealth: Payer: Self-pay | Admitting: Family Medicine

## 2017-09-01 ENCOUNTER — Ambulatory Visit
Admission: RE | Admit: 2017-09-01 | Discharge: 2017-09-01 | Disposition: A | Discharge status: Home / Self Care | Payer: Self-pay | Source: Ambulatory Visit | Attending: Family Medicine | Admitting: Family Medicine

## 2017-09-01 DIAGNOSIS — J189 Pneumonia, unspecified organism: Secondary | ICD-10-CM

## 2017-09-01 DIAGNOSIS — J181 Lobar pneumonia, unspecified organism: Principal | ICD-10-CM

## 2017-09-01 NOTE — Telephone Encounter (Signed)
Please call patient this morning and ask that she go to have her CXR done this morning so that we can discuss at her visit today. Thanks!

## 2017-09-01 NOTE — Telephone Encounter (Signed)
Patient was notified also by Tameka to do chest xray.

## 2017-09-05 ENCOUNTER — Telehealth: Payer: Self-pay

## 2017-09-05 NOTE — Telephone Encounter (Signed)
Left a message to call our office back regarding labs

## 2017-10-06 ENCOUNTER — Other Ambulatory Visit: Payer: Self-pay | Admitting: Family Medicine

## 2017-10-06 DIAGNOSIS — F339 Major depressive disorder, recurrent, unspecified: Secondary | ICD-10-CM

## 2017-10-06 NOTE — Telephone Encounter (Signed)
Copied from CRM #[id number]. Topic: Inquiry >> Oct 06, 2017  2:34 PM Windy KalataMichael, Ola Raap L, NT wrote: Reason for CRM: pt is scheduled to visit on the 10/20/2017 her medication for Sertraline runs out the 10/10/2017. She is requesting atleast a 10 day supply to last her until she is seen

## 2017-10-07 MED ORDER — SERTRALINE HCL 50 MG PO TABS: 50.0000 mg | ORAL_TABLET | Freq: Every day | ORAL | 0 refills | Status: DC

## 2017-10-09 ENCOUNTER — Telehealth: Payer: Self-pay

## 2017-10-09 ENCOUNTER — Other Ambulatory Visit: Payer: Self-pay | Admitting: Family Medicine

## 2017-10-09 DIAGNOSIS — F339 Major depressive disorder, recurrent, unspecified: Secondary | ICD-10-CM

## 2017-10-09 NOTE — Telephone Encounter (Signed)
Prescription refill for sertraline was authorized for 90 days

## 2017-10-09 NOTE — Telephone Encounter (Signed)
Copied from CRM #[id number]. Topic: Inquiry >> Oct 06, 2017  2:34 PM Annette KalataMichael, [name], NT wrote: Reason for CRM: pt is scheduled to visit on the 10/20/2017 her medication for Sertraline runs out the 10/10/2017. She is requesting atleast a 10 day supply to last her until she is seen  >> Oct 09, 2017  9:53 AM Cipriano BunkerLambe, Annette S wrote: Today was last day on medication and her appt. With doctor 11/12 2:20 and needs a bridge to make it to appt.  [facility] Please call patient regarding this prescription if sent to pharmacy

## 2017-10-20 ENCOUNTER — Encounter: Payer: Self-pay | Admitting: Family Medicine

## 2017-10-20 ENCOUNTER — Ambulatory Visit (INDEPENDENT_AMBULATORY_CARE_PROVIDER_SITE_OTHER): Payer: Self-pay | Admitting: Family Medicine

## 2017-10-20 VITALS — BP 110/72 | HR 82 | Temp 98.4°F | Resp 16 | Wt 98.4 lb

## 2017-10-20 DIAGNOSIS — F339 Major depressive disorder, recurrent, unspecified: Secondary | ICD-10-CM

## 2017-10-20 DIAGNOSIS — F419 Anxiety disorder, unspecified: Secondary | ICD-10-CM

## 2017-10-20 MED ORDER — SERTRALINE HCL 100 MG PO TABS
100.0000 mg | ORAL_TABLET | Freq: Every day | ORAL | 0 refills | Status: DC
Start: 2017-10-20 — End: 2019-02-24

## 2017-10-20 NOTE — Progress Notes (Signed)
The following letter was provided to Annette Bowers during their visit today: ---------------------------------------  Dear valued [facility] Patient,  I am writing to share that as of January 23, 2018, I will no longer be seeing patients at [facility]. While it has been my privilege to care for you as a physician, I have decided to [place] outside of [place] to pursue other opportunities.  The staff at [facility] has been supportive of my decision and are supportive of any patients who have been under my care.  They will be happy to provide care to you and your family.  The office staff will do everything they can to ensure a seamless transition of care at [facility].  However if you are on any controlled substance medications (i.e. Pain medication or benzodiazepines), they will no longer be able to refill those medications, but we will be more than happy to refer you to a specialist.  [facility] will also assist you with the transfer of medical records should you wish to seek care elsewhere.  If you have any questions about your future care, you may call the office at [phone].  I have enjoyed getting to know my patients here and I wish you the very best.  Sincerely,  Velta AddisonSyed Asad Shah, MD  ---------------------------------------  A written copy of this letter was given to the patient.  The patient verbalizes understanding of the letter, and does request referral to specialist or to new primary care provider.  This decision has been conveyed to Dr. Sherryll BurgerShah, who will place any appropriate referrals during today's visit.

## 2017-10-20 NOTE — Progress Notes (Signed)
Name: Annette Bowers   MRN: [id number]    DOB: Jun 27, 1986   Date:10/20/2017       Progress Note  Subjective  Chief Complaint  Chief Complaint  Patient presents with  . Anxiety    f/u   . Depression    f/u     Anxiety  Presents for follow-up visit. Symptoms include depressed mood, excessive worry, irritability, nervous/anxious behavior and panic. Symptoms occur most days. The severity of symptoms is moderate and causing significant distress. The quality of sleep is poor.    Depression         This is a chronic problem.  The onset quality is gradual.   The problem has been gradually worsening since onset.  Associated symptoms include fatigue, decreased interest and sad.  Associated symptoms include no helplessness and no hopelessness.  Past treatments include SSRIs - Selective serotonin reuptake inhibitors.  Past medical history includes anxiety.     Past Medical History:  Diagnosis Date  . Anxiety   . Insomnia   . Migraine   . Moderate major depression (HCC)     Past Surgical History:  Procedure Laterality Date  . WISDOM TOOTH EXTRACTION  2011    Family History  Problem Relation Age of Onset  . Diabetes Father   . Diabetes Maternal Grandmother   . Heart disease Maternal Grandmother   . Diabetes Maternal Grandfather   . Lung cancer Paternal Grandmother   . Multiple sclerosis Cousin     Social History   Socioeconomic History  . Marital status: Single    Spouse name: Not on file  . Number of children: 1  . Years of education: Not on file  . Highest education level: Not on file  Social Needs  . Financial resource strain: Not on file  . Food insecurity - worry: Not on file  . Food insecurity - inability: Not on file  . Transportation needs - medical: Not on file  . Transportation needs - non-medical: Not on file  Occupational History  . Not on file  Tobacco Use  . Smoking status: Current Every Day Smoker    Packs/day: 0.50    Years: 8.00    Pack years: 4.00     Types: Cigarettes  . Smokeless tobacco: Never Used  Substance and Sexual Activity  . Alcohol use: No    Alcohol/week: 0.0 oz  . Drug use: No  . Sexual activity: Not on file  Other Topics Concern  . Not on file  Social History Narrative  . Not on file     Current Outpatient Medications:  .  ALPRAZolam (XANAX) 0.5 MG tablet, Take 0.5 tablets 3 (three) times daily by mouth., Disp: , Rfl: 2 .  sertraline (ZOLOFT) 50 MG tablet, Take 1 tablet (50 mg total) by mouth daily., Disp: 90 tablet, Rfl: 0 .  benzonatate (TESSALON PERLES) 100 MG capsule, Take 1 capsule (100 mg total) by mouth 3 (three) times daily as needed for cough. (Patient not taking: Reported on 10/20/2017), Disp: 20 capsule, Rfl: 0 .  budesonide-formoterol (SYMBICORT) 160-4.5 MCG/ACT inhaler, Inhale 2 puffs into the lungs 2 (two) times daily. (Patient not taking: Reported on 10/20/2017), Disp: 1 Inhaler, Rfl: 0 .  levofloxacin (LEVAQUIN) 500 MG tablet, Take 1 tablet (500 mg total) by mouth daily. (Patient not taking: Reported on 10/20/2017), Disp: 7 tablet, Rfl: 0 .  sertraline (ZOLOFT) 50 MG tablet, TAKE 1 TABLET BY MOUTH EVERY DAY (Patient not taking: Reported on 10/20/2017), Disp: 90 tablet, Rfl:  0  Allergies  Allergen Reactions  . Promethazine Hcl     drowsy  . Topiramate     drowsy  . Trazodone     groggy     Review of Systems  Constitutional: Positive for fatigue and irritability.  Psychiatric/Behavioral: Positive for depression. The patient is nervous/anxious.     Objective  Vitals:   10/20/17 1434  BP: 110/72  Pulse: 82  Resp: 16  Temp: 98.4 F (36.9 C)  TempSrc: Oral  SpO2: 99%  Weight: 98 lb 6.4 oz (44.6 kg)    Physical Exam  Constitutional: She is oriented to person, place, and time and well-developed, well-nourished, and in no distress.  HENT:  Head: Normocephalic and atraumatic.  Mouth/Throat: No posterior oropharyngeal erythema.  Cardiovascular: Normal rate and regular rhythm.  No  murmur heard. Pulmonary/Chest: Effort normal and breath sounds normal. No respiratory distress. She has no wheezes. She has no rales.  Abdominal: Soft. Bowel sounds are normal. There is no tenderness.  Neurological: She is alert and oriented to person, place, and time.  Nursing note and vitals reviewed.    Assessment & Plan  1. Major depression, recurrent, chronic (HCC) Worsening symptoms of depression, will increase sertraline to 100 mg daily, multiple life stressors, recheck in 3 months - sertraline (ZOLOFT) 100 MG tablet; Take 1 tablet (100 mg total) daily by mouth.  Dispense: 90 tablet; Refill: 0 - Ambulatory referral to Psychiatry  2. Anxiety Symptoms of anxiety overall stable, albeit slightly worse with concurrent depression, continue on alprazolam as prescribed, apprised of my impending departure from the practice and the need to see a specialist for follow-up of anxiety.  Patient verbalized agreement - ALPRAZolam (XANAX) 0.5 MG tablet; Take 0.5 tablets 3 (three) times daily by mouth.; Refill: 2   Annette Bowers A. Annette KurtzShah [facility] 10/20/2017 2:50 PM

## 2017-11-18 ENCOUNTER — Ambulatory Visit: Payer: Self-pay | Admitting: Family Medicine

## 2018-01-20 ENCOUNTER — Ambulatory Visit: Payer: Self-pay | Admitting: Family Medicine

## 2018-10-23 IMAGING — CR DG CHEST 2V
1 series · 2 of 2 positions shown · non-contrast
Comparison: None in PACs

CLINICAL DATA: Cough, chest congestion, wheezing, fever, and chills
with shortness of breath for the past 3 days. History of pneumonia,
daily smoker.

EXAM:
CHEST  2 VIEW

[Series 1: dg chest 2 view · 0.14mm/px · 2 of 2 slices shown]
[im 1/2]
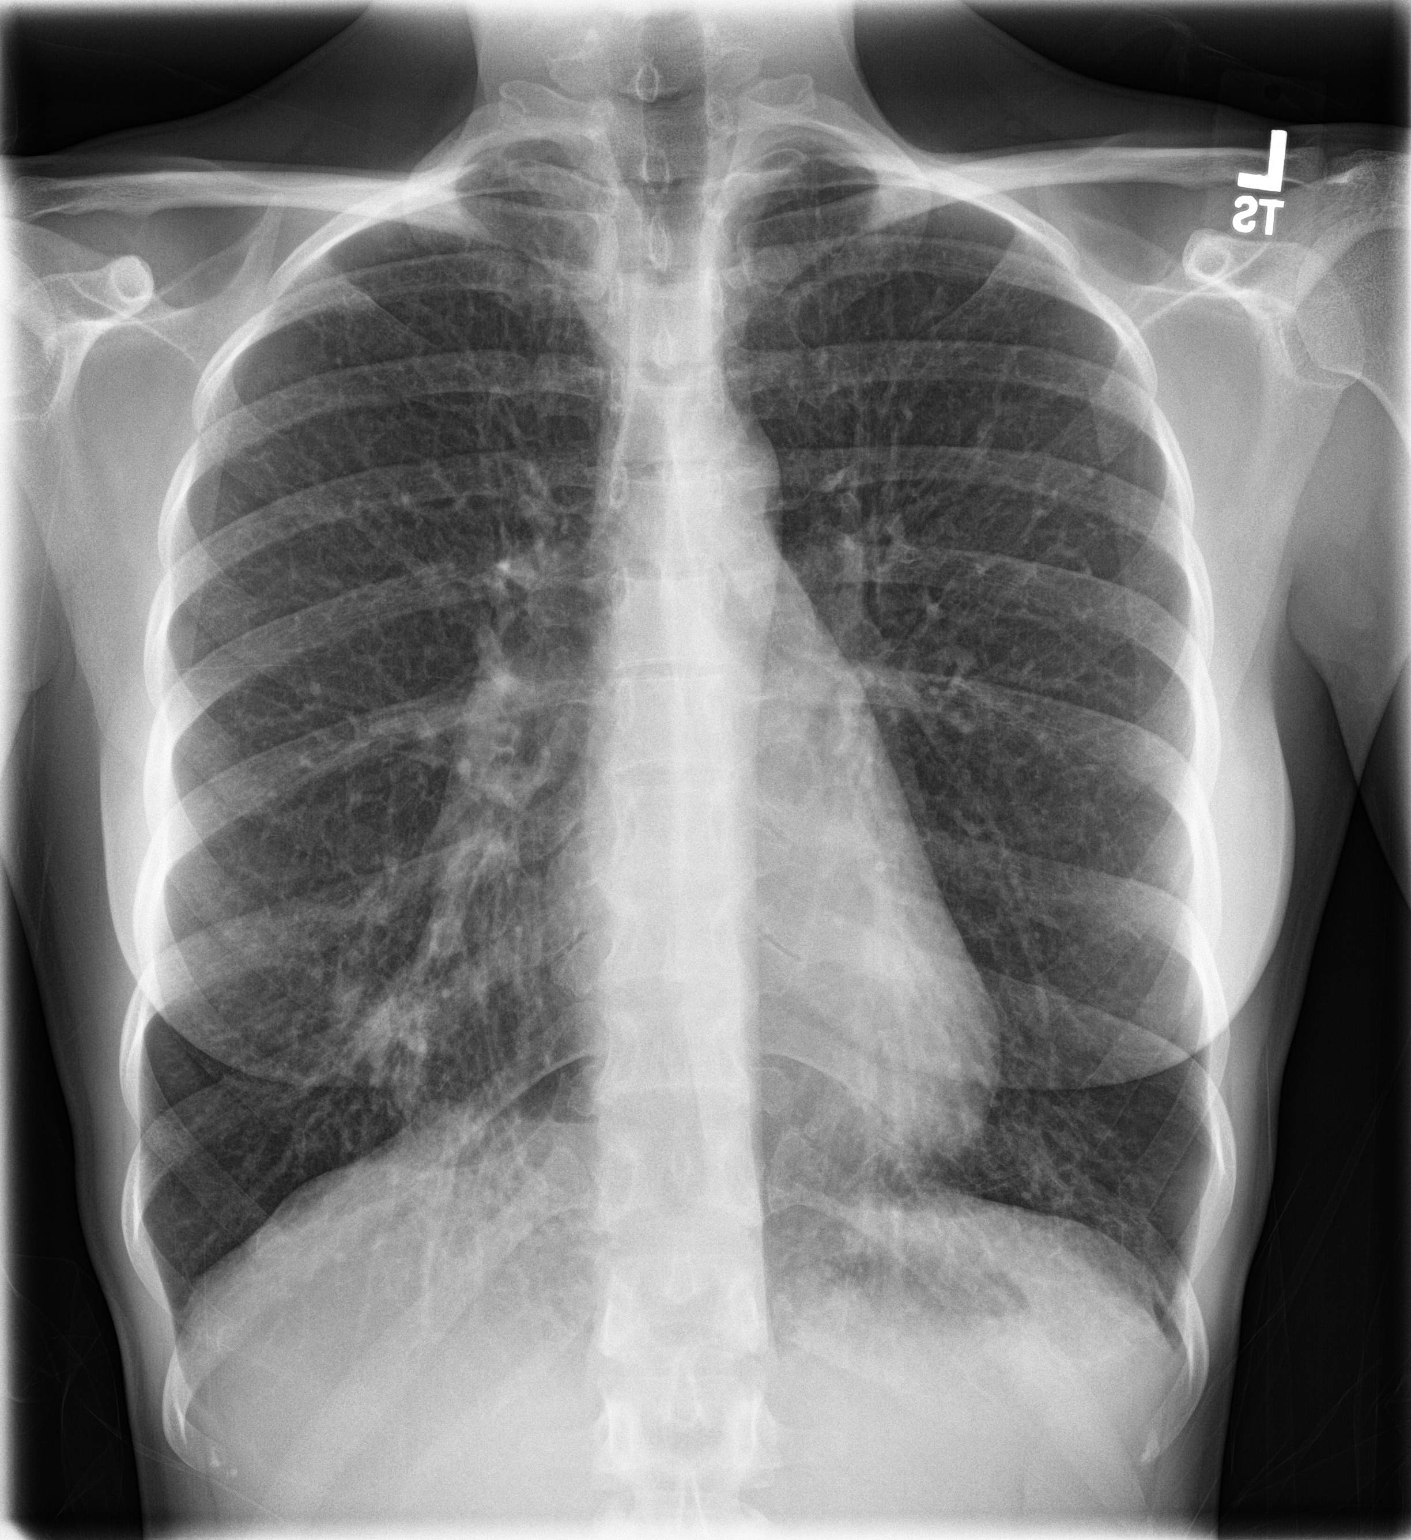
[im 2/2]
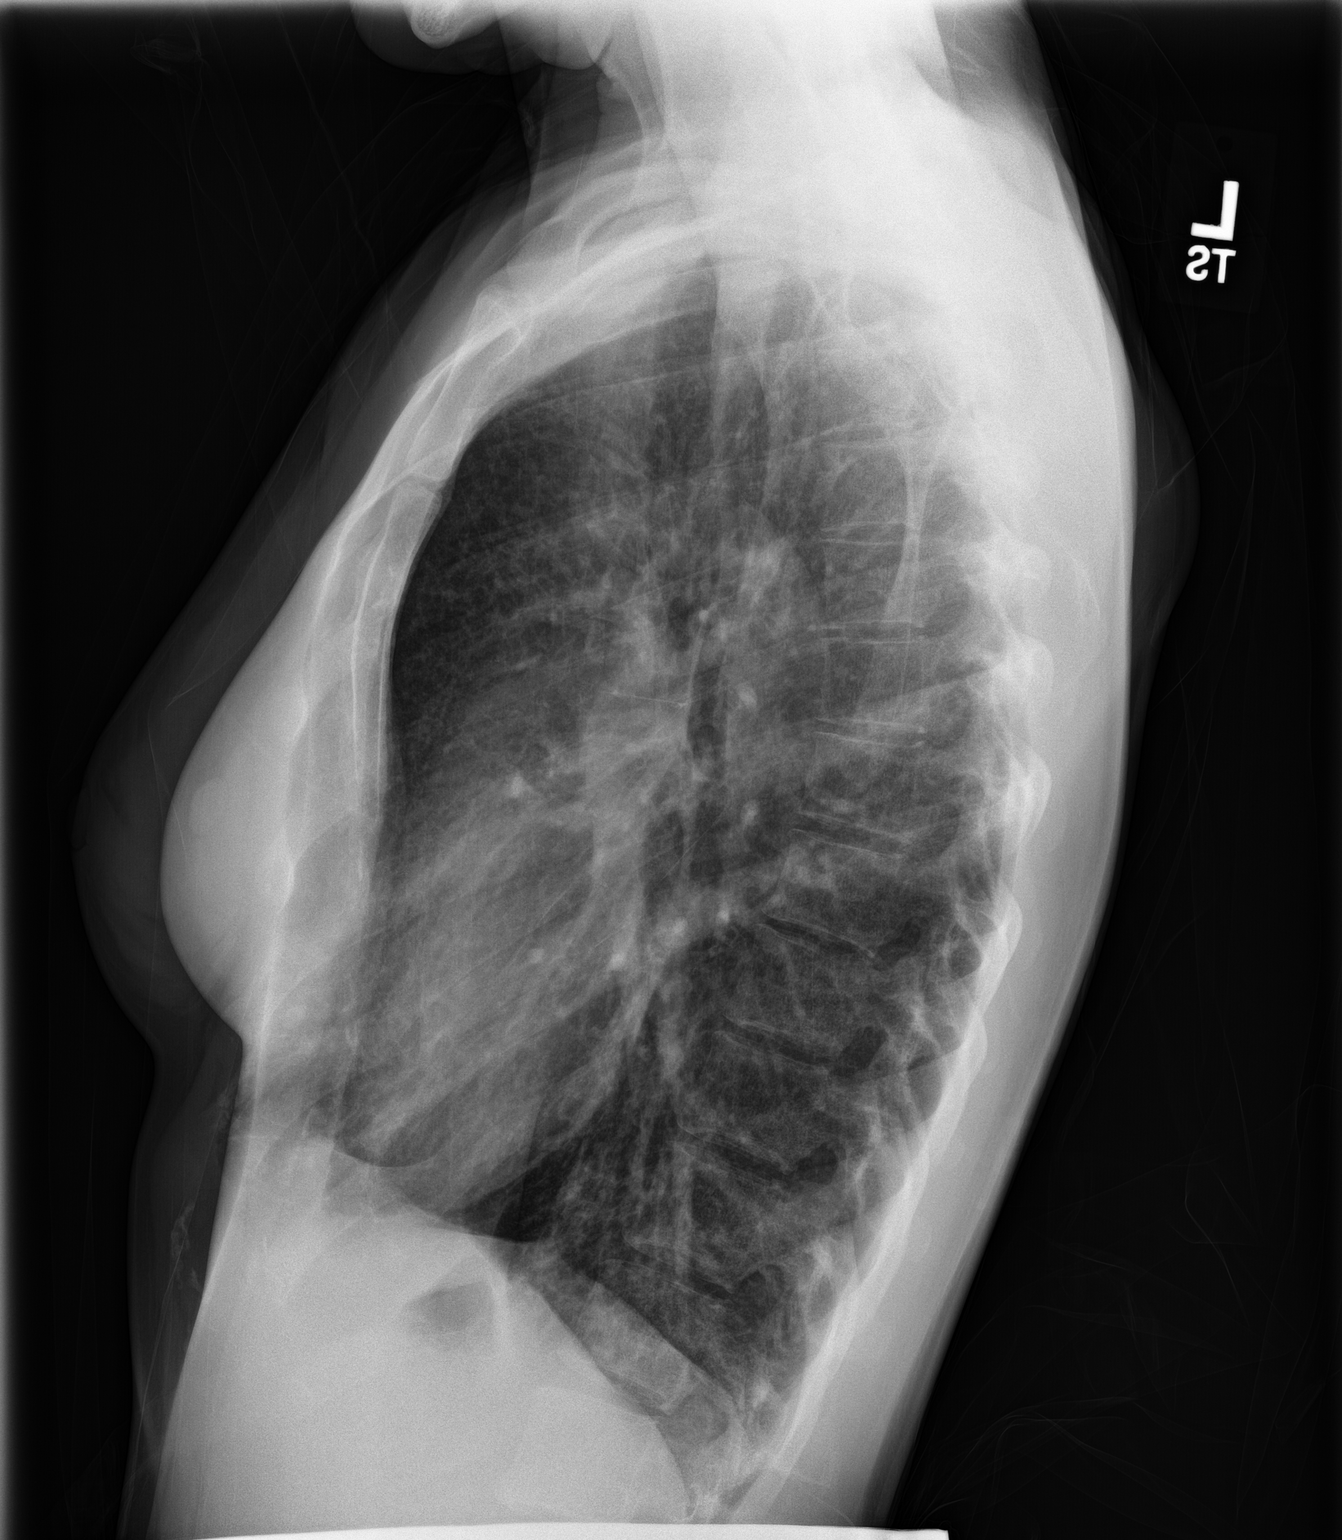

[2 of 2 positions shown; findings below may reference images not displayed]

FINDINGS: There is infiltrate in the right middle lobe. The lungs are
hyperinflated with hemidiaphragm flattening. The interstitial
markings are coarse. The heart and pulmonary vascularity are normal.
The mediastinum is normal in width. The bony thorax exhibits no
acute abnormality.
IMPRESSION: Patchy infiltrate in the right middle lobe consistent with
pneumonia. Follow-up radiographs following anticipated antibiotic
therapy are recommended in less the patient's symptoms completely
resolve.

Hyperinflation with diffuse interstitial prominence consistent with
reactive airway disease-COPD and the patient's smoking history.

## 2019-02-24 ENCOUNTER — Other Ambulatory Visit: Payer: Self-pay

## 2019-02-24 ENCOUNTER — Encounter: Payer: Self-pay | Admitting: Physician Assistant

## 2019-02-24 ENCOUNTER — Ambulatory Visit: Payer: Managed Care, Other (non HMO) | Admitting: Physician Assistant

## 2019-02-24 VITALS — BP 122/86 | HR 89 | Temp 98.4°F | Resp 16 | Ht 61.0 in | Wt 94.8 lb

## 2019-02-24 DIAGNOSIS — F419 Anxiety disorder, unspecified: Secondary | ICD-10-CM

## 2019-02-24 MED ORDER — HYDROXYZINE HCL 10 MG PO TABS: 10.0000 mg | ORAL_TABLET | Freq: Every day | ORAL | 0 refills | Status: DC | PRN

## 2019-02-24 MED ORDER — SERTRALINE HCL 50 MG PO TABS: 50.0000 mg | ORAL_TABLET | Freq: Every day | ORAL | 0 refills | Status: DC

## 2019-02-24 NOTE — Patient Instructions (Signed)
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
## 2019-03-29 ENCOUNTER — Ambulatory Visit (INDEPENDENT_AMBULATORY_CARE_PROVIDER_SITE_OTHER): Payer: Managed Care, Other (non HMO) | Admitting: Physician Assistant

## 2019-03-29 VITALS — Temp 98.8°F | Wt 105.0 lb

## 2019-03-29 DIAGNOSIS — F419 Anxiety disorder, unspecified: Secondary | ICD-10-CM

## 2019-03-29 DIAGNOSIS — F339 Major depressive disorder, recurrent, unspecified: Secondary | ICD-10-CM | POA: Diagnosis not present

## 2019-03-29 MED ORDER — SERTRALINE HCL 100 MG PO TABS: 100.0000 mg | ORAL_TABLET | Freq: Every day | ORAL | 1 refills | Status: DC

## 2019-03-29 MED ORDER — HYDROXYZINE HCL 25 MG PO TABS: 25.0000 mg | ORAL_TABLET | Freq: Every day | ORAL | 0 refills | Status: DC | PRN

## 2019-03-29 NOTE — Progress Notes (Signed)
Patient: Annette BusmanLindsey A Llanas Female    DOB: 1986-08-26   32 y.o.   MRN: [id number] Visit Date: 03/29/2019  Today's Provider: Trey SailorsAdriana M Pollak, PA-C   Chief Complaint  Patient presents with  . Anxiety   Subjective:    Virtual Visit via Video Note  I connected with Annette Bowers on 03/29/19 at  2:40 PM EDT by a video enabled telemedicine application and verified that I am speaking with the correct person using two identifiers.   I discussed the limitations of evaluation and management by telemedicine and the availability of in person appointments. The patient expressed understanding and agreed to proceed.   HPI   1 Month Follow Up for depression. Patient would like to discuss the effectiveness of the Sertraline. She doesn't feel that it is working well, but does report she feels like she might get through day a little better. She is taking it every day and denies side effects. She reports she is essentially living at her mom's house due to COVID. She has gained some weight since last visit. She reports she is sleeping a bit better. She is having to use hydroxyzine every day and also reports frequent moments where she feels like depression and anxiety are overwhelming. She denies SI/HI.  Wt Readings from Last 3 Encounters:  03/29/19 105 lb (47.6 kg)  02/24/19 94 lb 12.8 oz (43 kg)  10/20/17 98 lb 6.4 oz (44.6 kg)      Wt Readings from Last 3 Encounters:  03/29/19 105 lb (47.6 kg)  02/24/19 94 lb 12.8 oz (43 kg)  10/20/17 98 lb 6.4 oz (44.6 kg)     Allergies  Allergen Reactions  . Promethazine Hcl     drowsy  . Topiramate     drowsy  . Trazodone     groggy     Current Outpatient Medications:  .  hydrOXYzine (ATARAX/VISTARIL) 10 MG tablet, Take 1 tablet (10 mg total) by mouth daily as needed., Disp: 30 tablet, Rfl: 0 .  sertraline (ZOLOFT) 50 MG tablet, Take 1 tablet (50 mg total) by mouth daily., Disp: 90 tablet, Rfl: 0  Review of Systems  All other systems  reviewed and are negative.   Social History   Tobacco Use  . Smoking status: Current Every Day Smoker    Packs/day: 0.50    Years: 8.00    Pack years: 4.00    Types: Cigarettes  . Smokeless tobacco: Never Used  Substance Use Topics  . Alcohol use: Yes    Alcohol/week: 3.0 - 4.0 standard drinks    Types: 3 - 4 Glasses of wine per week      Objective:   Temp 98.8 F (37.1 C) (Oral)   Wt 105 lb (47.6 kg)   BMI 19.84 kg/m  Vitals:   03/29/19 1142  Temp: 98.8 F (37.1 C)  TempSrc: Oral  Weight: 105 lb (47.6 kg)     Physical Exam Constitutional:      Appearance: Normal appearance.  Pulmonary:     Effort: Pulmonary effort is normal. No respiratory distress.  Neurological:     Mental Status: She is alert.  Psychiatric:        Behavior: Behavior normal.     Comments: Affect appears brighter than before.          Assessment & Plan    1. Anxiety  Increase to zoloft 100 mg daily. Psychology referral was never updated and thus I will refer to CCM  for counseling services. Increase dose of PRN hydroxyzine. Follow up in one month.  - sertraline (ZOLOFT) 100 MG tablet; Take 1 tablet (100 mg total) by mouth daily.  Dispense: 90 tablet; Refill: 1 - hydrOXYzine (ATARAX/VISTARIL) 25 MG tablet; Take 1 tablet (25 mg total) by mouth daily as needed.  Dispense: 30 tablet; Refill: 0 - Ambulatory referral to Chronic Care Management Services  2. Major depression, recurrent, chronic (HCC)  - sertraline (ZOLOFT) 100 MG tablet; Take 1 tablet (100 mg total) by mouth daily.  Dispense: 90 tablet; Refill: 1 - hydrOXYzine (ATARAX/VISTARIL) 25 MG tablet; Take 1 tablet (25 mg total) by mouth daily as needed.  Dispense: 30 tablet; Refill: 0 - Ambulatory referral to Chronic Care Management Services  The entirety of the information documented in the History of Present Illness, Review of Systems and Physical Exam were personally obtained by me. Portions of this information were initially  documented by Lexine Baton, LPN and reviewed by me for thoroughness and accuracy.    Patient location: home Provider location: [facility]/home office  Persons involved in the visit: patient, provider         Trey Sailors, PA-C  [facility]

## 2019-03-29 NOTE — Patient Instructions (Signed)

## 2019-04-09 ENCOUNTER — Ambulatory Visit: Payer: Self-pay

## 2019-04-09 DIAGNOSIS — F339 Major depressive disorder, recurrent, unspecified: Secondary | ICD-10-CM

## 2019-04-09 DIAGNOSIS — Z720 Tobacco use: Secondary | ICD-10-CM

## 2019-04-09 DIAGNOSIS — F419 Anxiety disorder, unspecified: Secondary | ICD-10-CM

## 2019-04-09 NOTE — Chronic Care Management (AMB) (Signed)
  Care Management   Note  04/09/2019 Name: [name] MRN: [id number] DOB: 04-14-1986  Annette Bowers is a 33 year old female who sees Osvaldo Angst, [place] for primary care. [name] asked the CCM team to consult the patient for care coordination and chronic case management, specifically counseling and resources for depression and anxiety. Patient has a history of but not limited to Anxiety, Major Depression and Tobacco abuse.  Telephone outreach to patient today to introduce CCM services.  SDOH (Social Determinants of Health) screening performed today. See Care Plan Entry related to challenges with: Depression   Tobacco Use   Patient assessed and no needs for CCM Clinic RN CM and Pharmacist identified.  Ms. Pressman was given information about Care Management services today including:  1. Case Management services include personalized support from designated clinical staff supervised by a physician, including individualized plan of care and coordination with other care providers 2. 24/7 contact phone numbers for assistance for urgent and routine care needs. 3. The patient may stop CCM services at any time (effective at the end of the month) by phone call to the office staff.   Patient agreed to services and verbal consent obtained.     Plan: Telephone appointment scheduled with CCM Clinic SW next week  Ruslan Mccabe E. Suzie Portela, RN, BSN Nurse Care Coordinator [facility] Care Management [phone]

## 2019-04-10 IMAGING — CR DG CHEST 2V
1 series · 2 of 2 positions shown · non-contrast
Comparison: Chest x-ray of February 19, 2017

CLINICAL DATA: Two days of cough and wheezing with onset of fever,
chills, and body aches yesterday. History of previous episodes of
pneumonia, current smoker.

EXAM:
CHEST  2 VIEW

[Series 1: dg chest 2 view · 0.14mm/px · 2 of 2 slices shown]
[im 1/2]
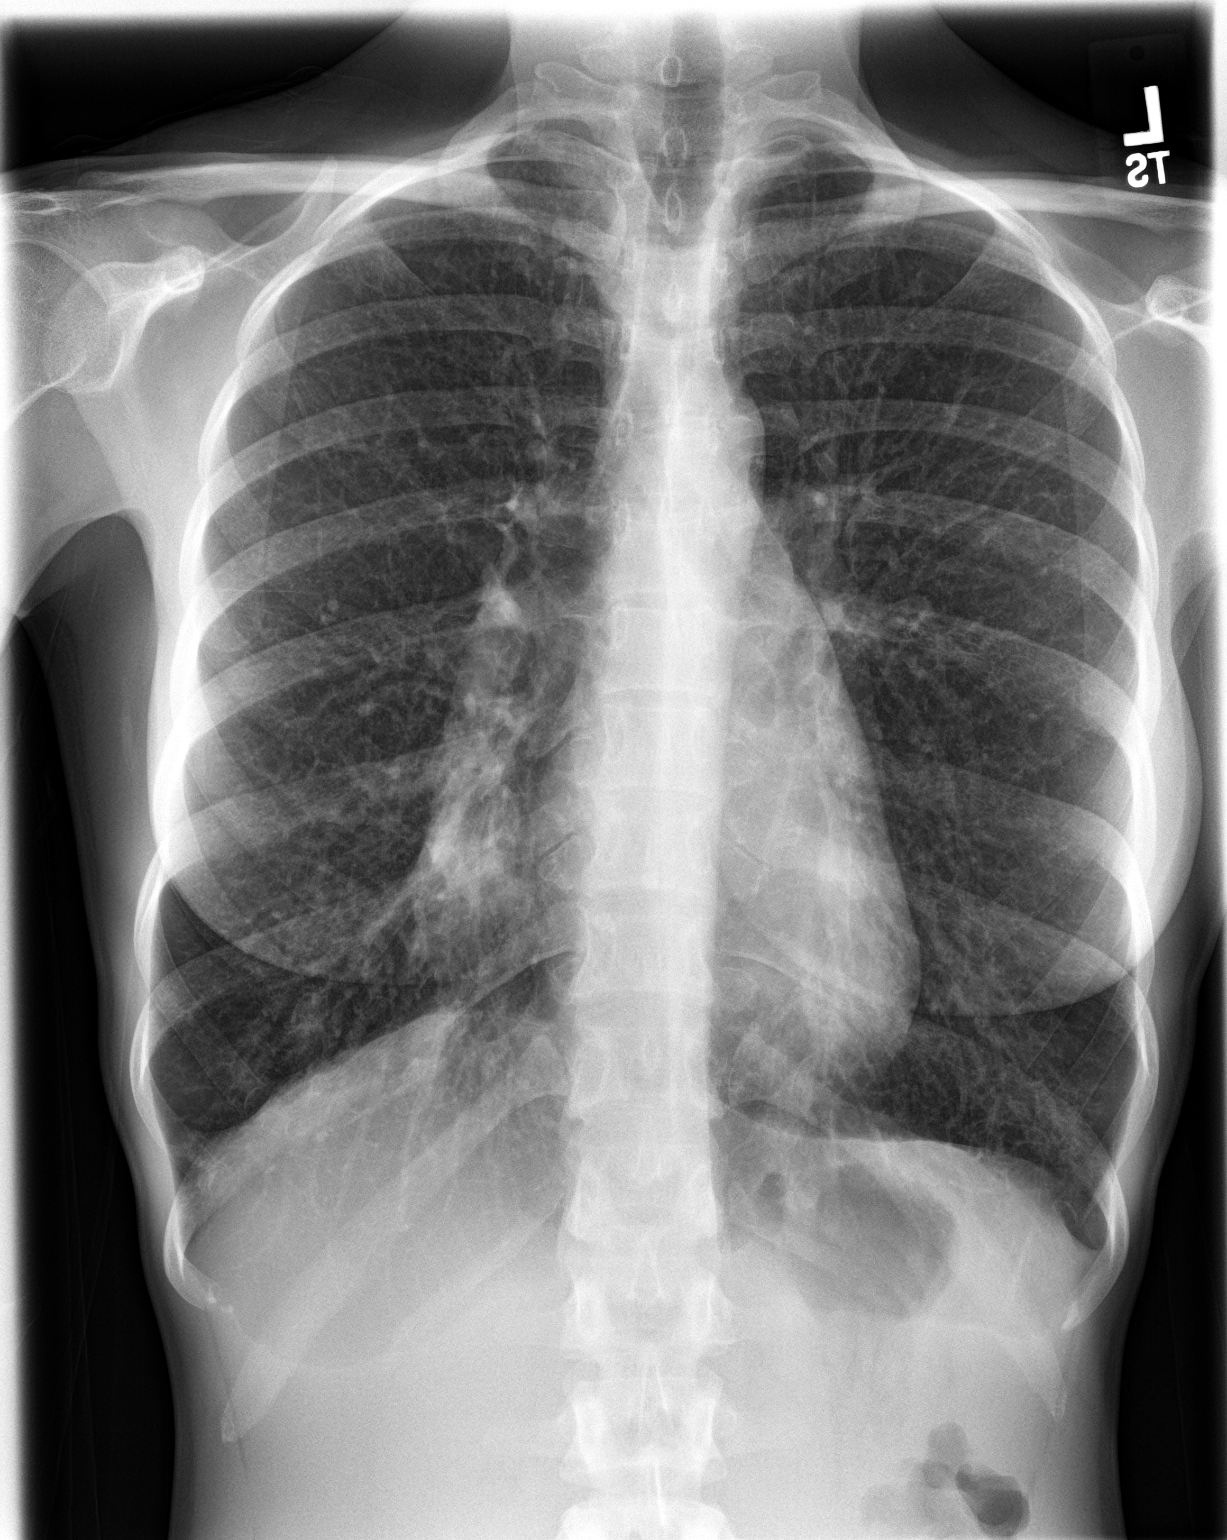
[im 2/2]
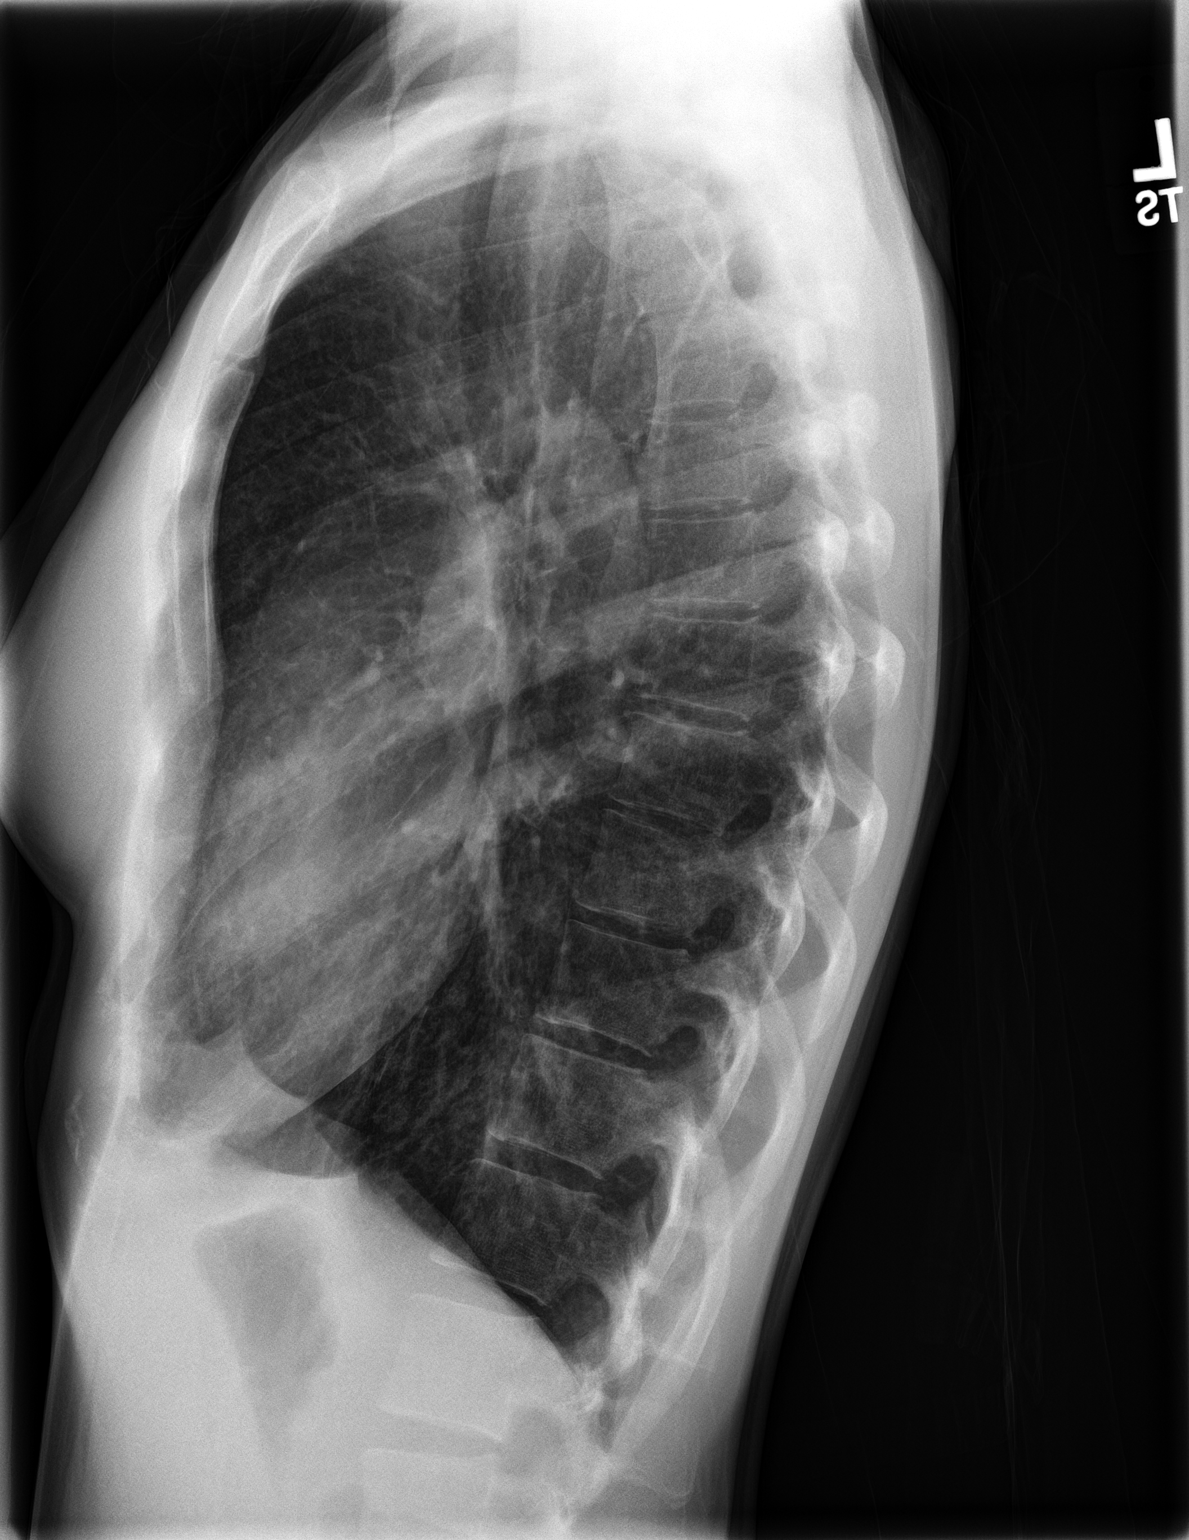

[2 of 2 positions shown; findings below may reference images not displayed]

FINDINGS: The lungs are markedly hyperinflated with hemidiaphragm flattening
the interstitial markings are coarse bilaterally but stable. There
is an infiltrate in the right middle lobe. There is no pleural
effusion. The heart and pulmonary vascularity are normal. The bony
thorax exhibits no acute abnormality.
IMPRESSION: Increased density in the right infrahilar region likely in the
middle lobe is worrisome for pneumonia. An occult slow-growing mass
is not excluded. Given the acute symptoms, a trial of antibiotic
therapy is reasonable with a follow-up chest x-ray in 2-3 weeks. If
complete clearing does not occur by that time or if the patient's
symptoms worsen in the meantime, chest CT scanning would be the most
useful next imaging step.

## 2019-04-16 ENCOUNTER — Ambulatory Visit: Payer: Self-pay | Admitting: *Deleted

## 2019-04-16 DIAGNOSIS — F419 Anxiety disorder, unspecified: Secondary | ICD-10-CM

## 2019-04-16 DIAGNOSIS — F339 Major depressive disorder, recurrent, unspecified: Secondary | ICD-10-CM

## 2019-04-16 NOTE — Chronic Care Management (AMB) (Signed)
   Care Management   Social Work Note  04/16/2019 Name: [name] MRN: [id number] DOB: [birth year]  Annette Bowers is a 33 y.o. year old female who sees Trey Sailors, [place] for primary care. The CCM team was consulted for assistance with Mental Health Counseling and Resources.   Phone call made to patient today, however she was working. Per patient, she works Monday-Friday 8:30-5:00pm and was not prepared for the call today. Phone call rescheduled for 04/20/19. Goals Addressed   None     Follow Up Plan: Appointment scheduled for SW to follow up with patient by phone regarding scheduling on 04/20/19 at 1:00pm    Verna Czech, LCSW Clinical Social Worker  [facility] Care Management [phone]

## 2019-04-16 NOTE — Patient Instructions (Signed)
Thank you allowing the Chronic Care Management Team to be a part of your care! It was a pleasure speaking with you today!  1. Please expect a call from the CCM social worker on 04/20/2019 at 1:00pm       CCM (Chronic Care Management) Team   Yvone Neu RN, BSN Nurse Care Coordinator  [phone]  Karalee Height PharmD  Clinical Pharmacist  [phone]   Lilana Blasko, LCSW Clinical Social Worker [phone]  Goals Addressed   None      The patient verbalized understanding of instructions provided today and declined a print copy of patient instruction materials.   Telephone follow up appointment with CCM team member scheduled for: 04/20/2019

## 2019-04-20 ENCOUNTER — Ambulatory Visit: Payer: Self-pay | Admitting: *Deleted

## 2019-04-20 DIAGNOSIS — F419 Anxiety disorder, unspecified: Secondary | ICD-10-CM

## 2019-04-20 DIAGNOSIS — F339 Major depressive disorder, recurrent, unspecified: Secondary | ICD-10-CM

## 2019-04-21 ENCOUNTER — Encounter: Payer: Self-pay | Admitting: *Deleted

## 2019-04-21 ENCOUNTER — Telehealth: Payer: Self-pay

## 2019-04-21 NOTE — Chronic Care Management (AMB) (Signed)
Initial goal documentation Chronic Care Management    Clinical Social Work INITIAL Behavioral Health Screening Note  04/21/2019 Name: Annette Bowers MRN: [id number] DOB: [birth year]  Annette Bowers is a 33 y.o. year old female who sees Trey Sailors, [place] for primary care. Beverly Gust PA-C asked the CCM team to consult the patient for assistance with Mental Health Counseling and Resources.    Review of patient status, including review of consultants reports, relevant laboratory and other test results, and collaboration with appropriate care team members and the patient's provider was performed as part of comprehensive patient evaluation and provision of chronic care management services.   Patient and/or legal guardian verbally consented to meet with LCSW about presenting concerns.  OBJECTIVE:  Depression screen [facility] 2/9 04/21/2019 02/24/2019 10/20/2017  Decreased Interest 1 3 1   Down, Depressed, Hopeless 3 3 1   PHQ - 2 Score 4 6 2   Altered sleeping 2 2 1   Tired, decreased energy 1 3 1   Change in appetite 2 3 1   Feeling bad or failure about yourself  3 3 1   Trouble concentrating 1 1 1   Moving slowly or fidgety/restless 1 2 1   Suicidal thoughts 0 2 0  PHQ-9 Score 14 22 8   Difficult doing work/chores Somewhat difficult Extremely dIfficult Not difficult at all     No flowsheet data found.    Family/Social Information:  Housing Arrangement: Patient resides in her own home with her fiancee and their child      Family members/support persons in your life? Per patient, her fiancee is very supportive, however she has distanced herself from friends and family due to her anxiety issues and feeling like she is "crazy"    Transportation to appointments provided by patient  Financial concerns: Per patient, she   Food Security: none  Access to Dental Care: yes  Medication Concerns: noine(if patient is experiencing medication concerns, please refer to pharmacy)  Services Currently  in place:  none  Patient enjoys relaxing in her home  Patient reported stressors: patient discussed experiencing excessive worry and anxiety daily. She discussed difficulty "letting things go" until they are resolved by her standards. Patient reports daily worry about life circumstances in general. Patient discussed significant distress as it has caused conflict with her fiancee and isolation from family and friends.   Goals Addressed            This Visit's Progress   . " I would like to get my anxiety under contro" (pt-stated)       Current Barriers:  . Limited social support . Mental Health Concerns   Clinical Social Work Clinical Goal(s):  Marland Kitchen Over the next 30 days, client will work with SW to address concerns related to managing her anxiety  Interventions: . Patient interviewed and appropriate assessments performed . Provided mental health counseling with regard to managing her anxiety (mental health diagnosis or concern) . Discussed plans with patient for ongoing care management follow up and provided patient with direct contact information for care management team  Patient Self Care Activities:  . Performs ADL's independently . Performs IADL's independently . Calls provider office for new concerns or questions  Initial goal documentation        Follow Up Plan: SW will follow up with patient by phone over the next 2 weeks   Keimani Laufer, [place] Clinical Social Worker  [facility] Care Management [phone]

## 2019-04-21 NOTE — Patient Instructions (Signed)
Thank you allowing the Chronic Care Management Team to be a part of your care! It was a pleasure speaking with you today!  1. Please continue to follow up with the CCM social worker to assist with managing your anxiety. 2. Please call this social worker with any questions or concerns  CCM (Chronic Care Management) Team   Yvone Neu RN, BSN Nurse Care Coordinator  [phone]  Karalee Height PharmD  Clinical Pharmacist  [phone]   Chrystal Land, LCSW Clinical Social Worker [phone]  Goals Addressed            This Visit's Progress   . " I would like to get my anxiety under contro" (pt-stated)       Current Barriers:  . Limited social support . Mental Health Concerns   Clinical Social Work Clinical Goal(s):  Marland Kitchen Over the next 30 days, client will work with SW to address concerns related to managing her anxiety  Interventions: . Patient interviewed and appropriate assessments performed . Provided mental health counseling with regard to managing her anxiety (mental health diagnosis or concern) . Discussed plans with patient for ongoing care management follow up and provided patient with direct contact information for care management team  Patient Self Care Activities:  . Performs ADL's independently . Performs IADL's independently . Calls provider office for new concerns or questions  Initial goal documentation        The patient verbalized understanding of instructions provided today and declined a print copy of patient instruction materials.   The CM team will reach out to the patient again over the next 14 days.

## 2019-04-28 ENCOUNTER — Ambulatory Visit (INDEPENDENT_AMBULATORY_CARE_PROVIDER_SITE_OTHER): Payer: Managed Care, Other (non HMO) | Admitting: Physician Assistant

## 2019-04-28 DIAGNOSIS — F419 Anxiety disorder, unspecified: Secondary | ICD-10-CM

## 2019-04-28 DIAGNOSIS — G4700 Insomnia, unspecified: Secondary | ICD-10-CM | POA: Diagnosis not present

## 2019-04-28 DIAGNOSIS — F339 Major depressive disorder, recurrent, unspecified: Secondary | ICD-10-CM | POA: Diagnosis not present

## 2019-04-28 MED ORDER — TRAZODONE HCL 50 MG PO TABS: 25.0000 mg | ORAL_TABLET | Freq: Every evening | ORAL | 3 refills | Status: DC | PRN

## 2019-04-28 MED ORDER — HYDROXYZINE HCL 25 MG PO TABS: 25.0000 mg | ORAL_TABLET | Freq: Two times a day (BID) | ORAL | 1 refills | Status: DC | PRN

## 2019-04-28 MED ORDER — BUSPIRONE HCL 7.5 MG PO TABS: 7.5000 mg | ORAL_TABLET | Freq: Two times a day (BID) | ORAL | 0 refills | Status: AC

## 2019-04-28 NOTE — Progress Notes (Signed)
Subjective:    Patient ID: Annette Bowers, female    DOB: July 27, 1986, 33 y.o.   MRN: [id number]  [name] is a 33 y.o. female presenting on 04/28/2019 for No chief complaint on file.  Virtual Visit via Video Note  I connected with Annette Bowers on 04/28/19 at  8:00 AM EDT by a video enabled telemedicine application and verified that I am speaking with the correct person using two identifiers.   I discussed the limitations of evaluation and management by telemedicine and the availability of in person appointments. The patient expressed understanding and agreed to proceed.   Patient location: home Provider location: [facility]/home office  Persons involved in the visit: patient, provider   HPI  Presenting today for follow up of anxiety and depression today. She was increased on her zoloft from 50 mg to 100 mg daily. She feels that it may not be working. She feels anxious and depressed still. She has moments where she feels very nervous and as if something bad will happen. She has hydroxyzine PRN but is nervous about using it because she didn't want to run out. She has established with the clinic's Child psychotherapist for counseling sessions and has an upcoming session in the next week. Previously prescribed Buspar but she never ended up taking it because it was too expensive.   Social History   Tobacco Use  . Smoking status: Current Every Day Smoker    Packs/day: 0.50    Years: 8.00    Pack years: 4.00    Types: Cigarettes  . Smokeless tobacco: Never Used  Substance Use Topics  . Alcohol use: Yes    Alcohol/week: 3.0 - 4.0 standard drinks    Types: 3 - 4 Glasses of wine per week  . Drug use: No    Review of Systems Per HPI unless specifically indicated above     Objective:    There were no vitals taken for this visit.  Wt Readings from Last 3 Encounters:  03/29/19 105 lb (47.6 kg)  02/24/19 94 lb 12.8 oz (43 kg)  10/20/17 98 lb 6.4 oz (44.6 kg)     Physical Exam Constitutional:      Appearance: Normal appearance.  Skin:    General: Skin is warm and dry.  Neurological:     Mental Status: She is alert. Mental status is at baseline.  Psychiatric:        Mood and Affect: Mood is depressed.    Results for orders placed or performed in visit on 07/08/17  Vitamin D (25 hydroxy)  Result Value Ref Range   Vit D, 25-Hydroxy 40.3 30.0 - 100.0 ng/mL      Assessment & Plan:  1. Insomnia, unspecified type  - traZODone (DESYREL) 50 MG tablet; Take 0.5-1 tablets (25-50 mg total) by mouth at bedtime as needed for sleep.  Dispense: 30 tablet; Refill: 3  2. Anxiety  We will add buspar onto zoloft and follow up in one month for in person visit.   - busPIRone (BUSPAR) 7.5 MG tablet; Take 1 tablet (7.5 mg total) by mouth 2 (two) times daily.  Dispense: 180 tablet; Refill: 0  3. Major depression, recurrent, chronic (HCC)  - busPIRone (BUSPAR) 7.5 MG tablet; Take 1 tablet (7.5 mg total) by mouth 2 (two) times daily.  Dispense: 180 tablet; Refill: 0    Follow up plan: No follow-ups on file.  Osvaldo Angst, PA-C [facility] 04/28/2019, 8:16 AM

## 2019-04-28 NOTE — Patient Instructions (Addendum)

## 2019-05-05 ENCOUNTER — Ambulatory Visit: Payer: Self-pay | Admitting: *Deleted

## 2019-05-05 DIAGNOSIS — F419 Anxiety disorder, unspecified: Secondary | ICD-10-CM

## 2019-05-05 DIAGNOSIS — F339 Major depressive disorder, recurrent, unspecified: Secondary | ICD-10-CM

## 2019-05-05 IMAGING — CR DG CHEST 2V
1 series · 2 of 2 positions shown · non-contrast
Comparison: August 07, 2017

CLINICAL DATA: Follow-up right middle lobe pneumonia

EXAM:
CHEST  2 VIEW

[Series 1: dg chest 2 view · 0.14mm/px · 2 of 2 slices shown]
[im 1/2]
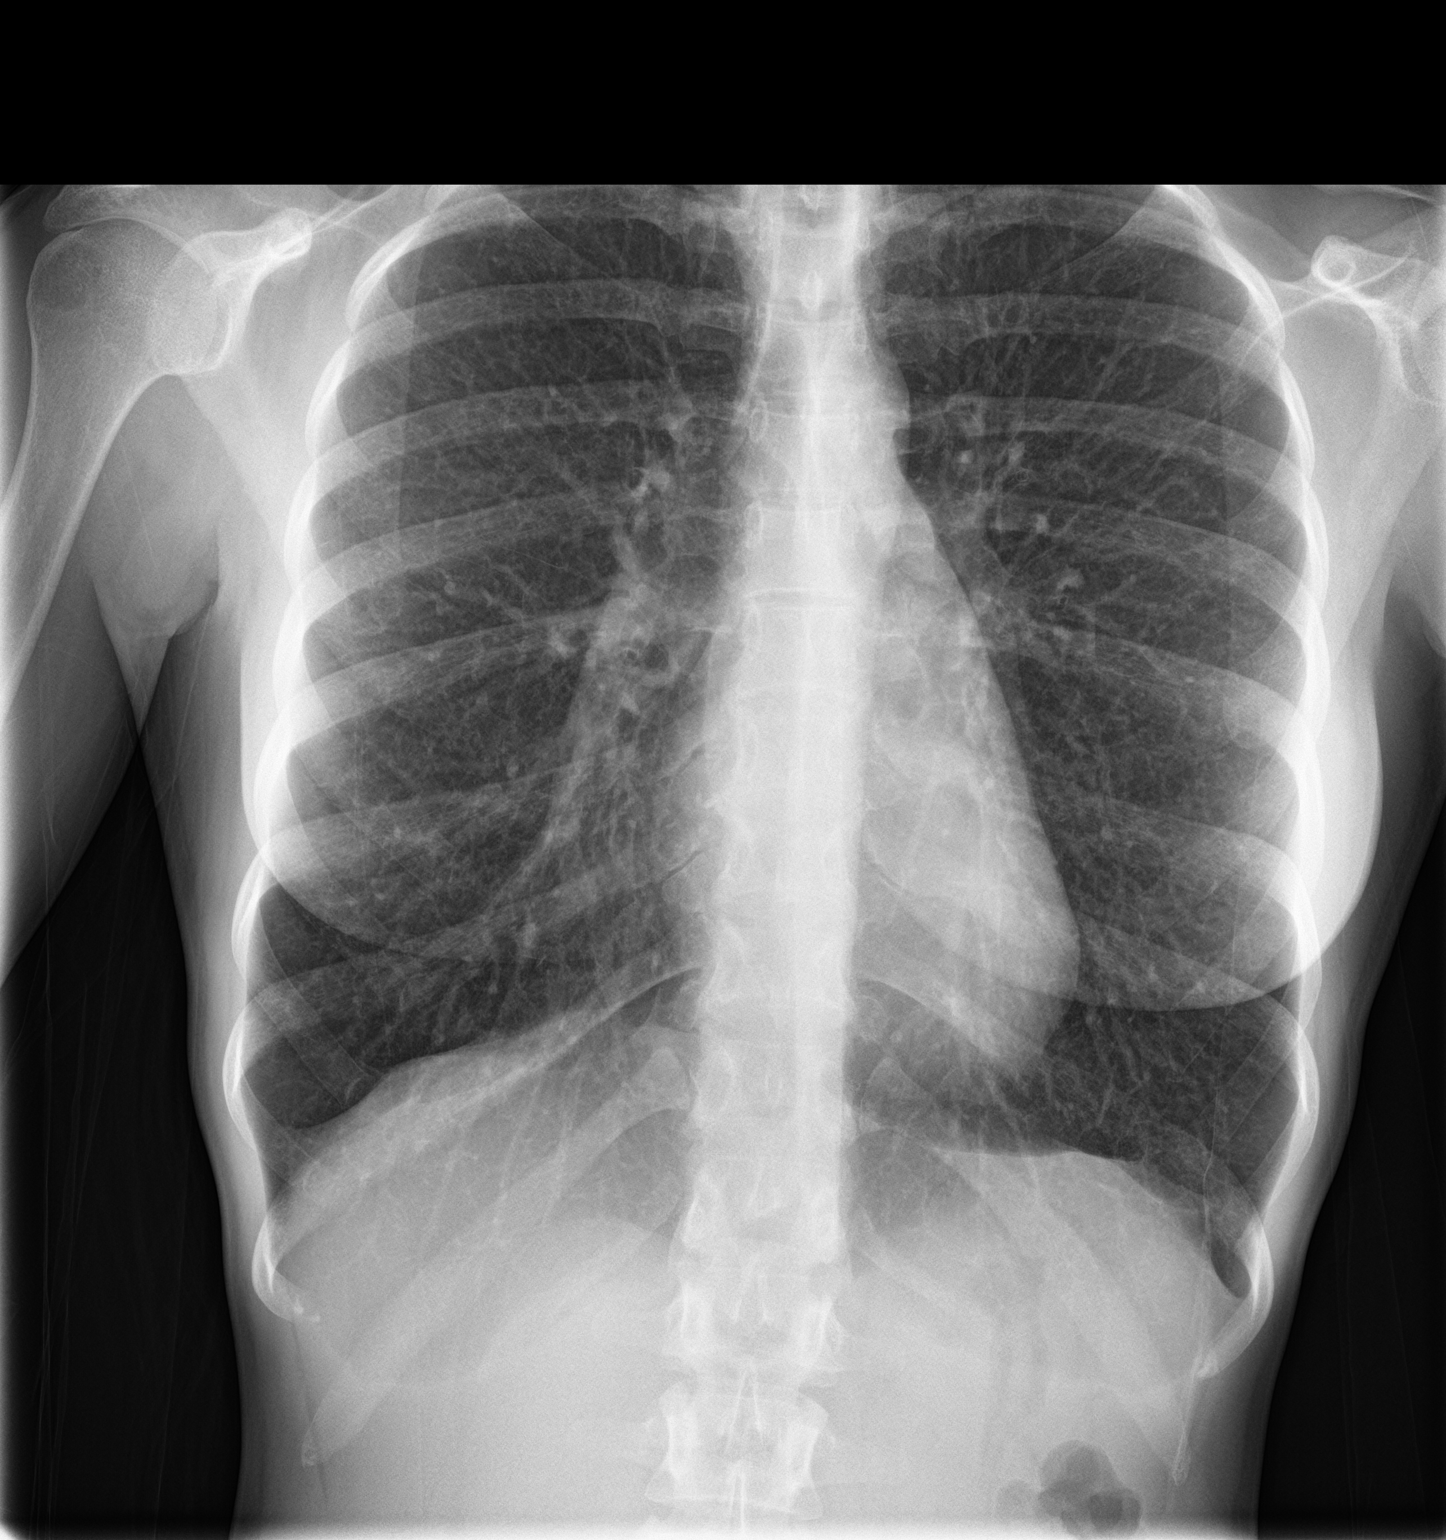
[im 2/2]
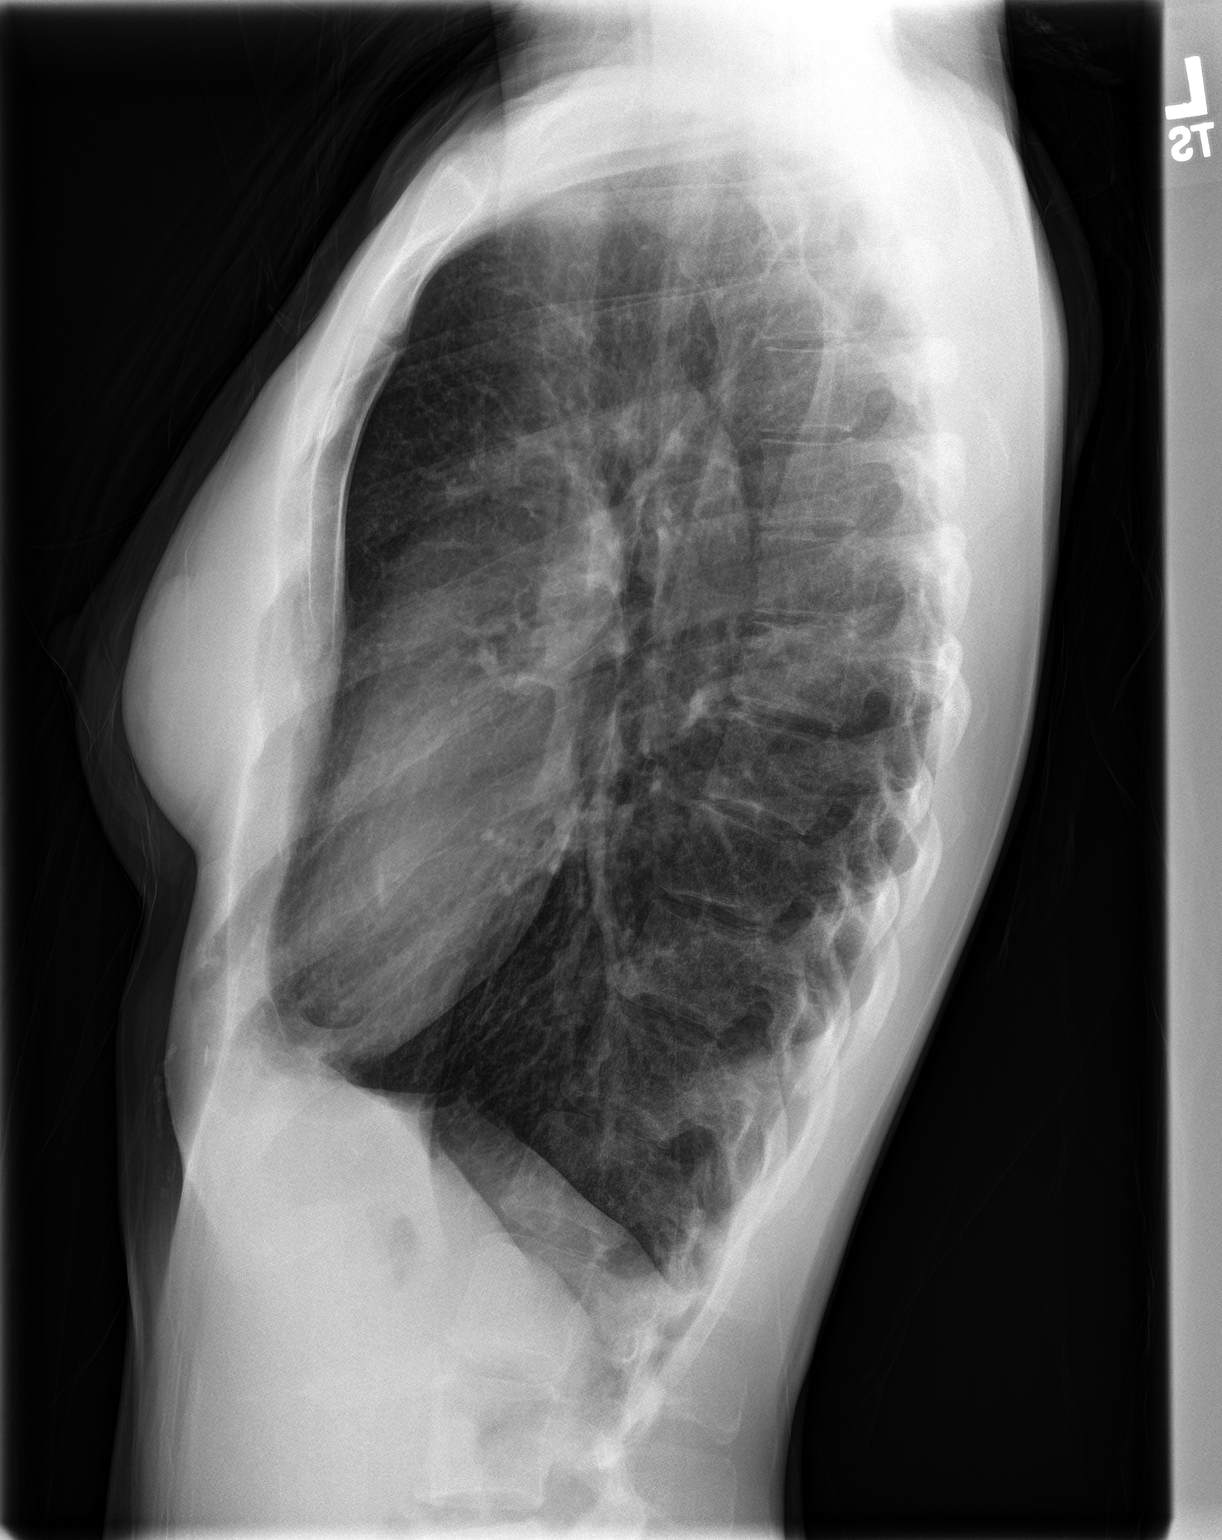

[2 of 2 positions shown; findings below may reference images not displayed]

FINDINGS: The right middle lobe infiltrate seen on the previous study has
resolved. The heart, hila, mediastinum, lungs lungs, and pleura are
otherwise normal.
IMPRESSION: Resolution of right middle lobe pneumonia.

## 2019-05-06 NOTE — Chronic Care Management (AMB) (Signed)
  Chronic Care Management    Clinical Social Work Follow Up Note  05/06/2019 Name: Annette Bowers MRN: [id number] DOB: 06-02-1986  Annette Bowers is a 33 y.o. year old female who is a primary care patient of Trey Sailors, [place]. The CCM team was consulted for assistance with Mental Health Counseling and Resources.   Review of patient status, including review of consultants reports, other relevant assessments, and collaboration with appropriate care team members and the patient's provider was performed as part of comprehensive patient evaluation and provision of chronic care management services.     Goals Addressed            This Visit's Progress   . " I would like to get my anxiety under control" (pt-stated)       This social worker spoke to patient by phone today. Patient discussed adding Buspar to her Zoloft during last doctor's visit to help with anxiety. Stressors related to work/life balance discussed.   Current Barriers:  . Limited social support . Mental Health Concerns   Clinical Social Work Clinical Goal(s):  Marland Kitchen Over the next 30 days, client will work with SW to address concerns related to managing her anxiety  Interventions: . Patient interviewed and appropriate assessments performed . Provided mental health counseling with regard to managing her anxiety (mental health diagnosis or concern) . Self care emphasized, prioritizing tasks an . Explored patient's irrational cognitive messages that reinforce anxiety . Assisted patient to develop reality based positive cognitive messages that will increase her confidence in coping with her anxietyd setting clear boundaries  Explored . Discussed plans with patient for ongoing care management follow up and provided patient with direct contact information for care management team  Patient Self Care Activities:  . Performs ADL's independently . Performs IADL's independently . Calls provider office for new concerns or questions   Initial goal documentation        Follow Up Plan: SW will follow up with patient by phone over the next 3 weeks    Alfie Rideaux, [place] Clinical Social Worker  [facility] Care Management [phone]

## 2019-05-26 ENCOUNTER — Telehealth: Payer: Self-pay | Admitting: *Deleted

## 2019-05-26 ENCOUNTER — Ambulatory Visit: Payer: Self-pay | Admitting: *Deleted

## 2019-05-26 DIAGNOSIS — F419 Anxiety disorder, unspecified: Secondary | ICD-10-CM

## 2019-05-26 DIAGNOSIS — F339 Major depressive disorder, recurrent, unspecified: Secondary | ICD-10-CM

## 2019-05-26 NOTE — Chronic Care Management (AMB) (Signed)
  Chronic Care Management    Clinical Social Work Follow Up Note  05/26/2019 Name: Annette Bowers MRN: [id number] DOB: June 30, 1986  Annette Bowers is a 33 y.o. year old female who is a primary care patient of Trinna Post, [place]. The CCM team was consulted for assistance with Mental Health Counseling and Resources.   Review of patient status, including review of consultants reports, other relevant assessments, and collaboration with appropriate care team members and the patient's provider was performed as part of comprehensive patient evaluation and provision of chronic care management services.     Goals    . " I would like to get my anxiety under control" (pt-stated)     Current Barriers:  . Limited social support . Mental Health Concerns   Clinical Social Work Clinical Goal(s):  Marland Kitchen Over the next 30 days, client will work with SW to address concerns related to managing her anxiety  Interventions: . Patient interviewed and appropriate assessments performed . Provided mental health counseling with regard to managing her anxiety (mental health diagnosis or concern) . Continued to emphasize, prioritizing tasks and setting clear boundaries  explored . Continued to explore irrational cognitive messages that reinforce anxiety . Processed patient's feelings regarding life's anxieties and need to feel "in control". . Discussed plans with patient for ongoing care management follow up and provided patient with direct contact information for care management team  Patient Self Care Activities:  . Performs ADL's independently . Performs IADL's independently . Calls provider office for new concerns or questions  Please see past updates related to this goal by clicking on the "Past Updates" button in the selected goal           Follow Up Plan: SW will follow up with patient by phone over the next 2 weeks    [place], [place] Worker  [place] [facility]  Care Management [phone]

## 2019-05-26 NOTE — Patient Instructions (Signed)
Thank you allowing the Chronic Care Management Team to be a part of your care! It was a pleasure speaking with you today!  1. Please call this social worker with any questions or concerns related to your mental health needs 2. Please continue to utilize desired self care strategies.   CCM (Chronic Care Management) Team   Trish Fountain RN, BSN Nurse Care Coordinator  [phone]  Ruben Reason PharmD  Clinical Pharmacist  [phone]   [place], LCSW Clinical Social Worker [phone]  Goals Addressed            This Visit's Progress   . " I would like to get my anxiety under control" (pt-stated)       Current Barriers:  . Limited social support . Mental Health Concerns   Clinical Social Work Clinical Goal(s):  Marland Kitchen Over the next 30 days, client will work with SW to address concerns related to managing her anxiety  Interventions: . Patient interviewed and appropriate assessments performed . Provided mental health counseling with regard to managing her anxiety (mental health diagnosis or concern) . Continued to emphasize, prioritizing tasks and setting clear boundaries  explored . Continued to explore irrational cognitive messages that reinforce anxiety . Processed patient's feelings regarding life's anxieties and need to feel "in control". . Discussed plans with patient for ongoing care management follow up and provided patient with direct contact information for care management team  Patient Self Care Activities:  . Performs ADL's independently . Performs IADL's independently . Calls provider office for new concerns or questions  Please see past updates related to this goal by clicking on the "Past Updates" button in the selected goal           The patient verbalized understanding of instructions provided today and declined a print copy of patient instruction materials.   Telephone follow up appointment with care management team member scheduled  for:06/09/19

## 2019-06-03 ENCOUNTER — Ambulatory Visit: Payer: Self-pay | Admitting: Physician Assistant

## 2019-06-09 ENCOUNTER — Ambulatory Visit: Payer: Self-pay | Admitting: *Deleted

## 2019-06-09 DIAGNOSIS — F419 Anxiety disorder, unspecified: Secondary | ICD-10-CM

## 2019-06-09 DIAGNOSIS — F339 Major depressive disorder, recurrent, unspecified: Secondary | ICD-10-CM

## 2019-06-09 NOTE — Patient Instructions (Signed)
Thank you allowing the Chronic Care Management Team to be a part of your care! It was a pleasure speaking with you today!  1. Please continued to utilize self care strategies when needed. 2. Please call this social worker with any questions or concerns regarding your mental health needs.  CCM (Chronic Care Management) Team   Trish Fountain RN, BSN Nurse Care Coordinator  [phone]  Ruben Reason PharmD  Clinical Pharmacist  [phone]   [place], LCSW Clinical Social Worker [phone]  Goals Addressed            This Visit's Progress   . " I would like to get my anxiety under control" (pt-stated)       Current Barriers:  . Limited social support . Mental Health Concerns   Clinical Social Work Clinical Goal(s):  Marland Kitchen Over the next 30 days, client will work with SW to address concerns related to managing her anxiety  Interventions: . Patient interviewed and appropriate assessments performed . Provided mental health counseling and support with regard to managing her anxiety due to her son's recent illness and hospitalization  . Processed patient's feelings regarding her son's recent illness and explored her current network of support and current coping strategies . Explored patient's perspective on prioritizing responsibilities (work/home) and accepting help offered by friends and family . Continued to process patient's feelings regarding conflicts experinced and need to feel "in control". . Discussed plans with patient for ongoing care management follow up and provided patient with direct contact information for care management team  Patient Self Care Activities:  . Performs ADL's independently . Performs IADL's independently . Calls provider office for new concerns or questions  Please see past updates related to this goal by clicking on the "Past Updates" button in the selected goal           The patient verbalized understanding of instructions  provided today and declined a print copy of patient instruction materials.   Telephone follow up appointment with care management team member scheduled for:06/23/19

## 2019-06-09 NOTE — Chronic Care Management (AMB) (Signed)
  Chronic Care Management    Clinical Social Work Follow Up Note  06/09/2019 Name: Annette Bowers MRN: [id number] DOB: Jun 15, 1986  Annette Bowers is a 33 y.o. year old female who is a primary care patient of Trinna Post, [place]. The CCM team was consulted for assistance with Mental Health Counseling and Resources.   Review of patient status, including review of consultants reports, other relevant assessments, and collaboration with appropriate care team members and the patient's provider was performed as part of comprehensive patient evaluation and provision of chronic care management services.     Goals Addressed            This Visit's Progress   . " I would like to get my anxiety under control" (pt-stated)       Current Barriers:  . Limited social support . Mental Health Concerns   Clinical Social Work Clinical Goal(s):  Marland Kitchen Over the next 30 days, client will work with SW to address concerns related to managing her anxiety  Interventions: . Patient interviewed and appropriate assessments performed . Provided mental health counseling and support with regard to managing her anxiety due to her son's recent illness and hospitalization  . Processed patient's feelings regarding her son's recent illness and explored her current network of support and current coping strategies . Explored patient's perspective on prioritizing responsibilities (work/home) and accepting help offered by friends and family . Continued to process patient's feelings regarding conflicts experinced and need to feel "in control". . Discussed plans with patient for ongoing care management follow up and provided patient with direct contact information for care management team  Patient Self Care Activities:  . Performs ADL's independently . Performs IADL's independently . Calls provider office for new concerns or questions  Please see past updates related to this goal by clicking on the "Past Updates" button in  the selected goal           Follow Up Plan: Appointment scheduled for SW follow up with client by phone on: 06/23/19    Elliot Gurney, [place] Worker  [facility] Care Management [phone]

## 2019-06-23 ENCOUNTER — Telehealth: Payer: Self-pay | Admitting: *Deleted

## 2019-06-23 ENCOUNTER — Ambulatory Visit: Payer: Self-pay | Admitting: *Deleted

## 2019-06-23 DIAGNOSIS — F339 Major depressive disorder, recurrent, unspecified: Secondary | ICD-10-CM

## 2019-06-23 DIAGNOSIS — F419 Anxiety disorder, unspecified: Secondary | ICD-10-CM

## 2019-06-23 NOTE — Chronic Care Management (AMB) (Signed)
   Chronic Care Management   Unsuccessful Call Note 06/23/2019 Name: Annette Bowers MRN: [id number] DOB: 1986/12/03  Patient  is a 33 year old female who sees Carles Collet, [place] for primary care. Carles Collet, PA-C asked the CCM team to consult the patient for Counseling and Mental Health Resources.     This social sas unable to reach patient via telephone today for follow up phone call.  I have left HIPAA compliant voicemail asking patient to return my call. (unsuccessful outreach #1).   Plan: Will follow-up within 7 business days via telephone.      Elliot Gurney, [place] Worker  [facility] [facility] Care Management [phone]

## 2019-06-24 ENCOUNTER — Other Ambulatory Visit: Payer: Self-pay | Admitting: Physician Assistant

## 2019-06-24 DIAGNOSIS — F419 Anxiety disorder, unspecified: Secondary | ICD-10-CM

## 2019-06-24 DIAGNOSIS — F339 Major depressive disorder, recurrent, unspecified: Secondary | ICD-10-CM

## 2019-07-02 ENCOUNTER — Telehealth: Payer: Self-pay | Admitting: *Deleted

## 2019-07-02 ENCOUNTER — Ambulatory Visit: Payer: Self-pay | Admitting: *Deleted

## 2019-07-02 NOTE — Chronic Care Management (AMB) (Signed)
   Chronic Care Management   Unsuccessful Call Note 07/02/2019 Name: BRIEL GALLICCHIO MRN: [id number] DOB: 1986/09/22  Patient  is a 33year old female who sees Carles Collet, [place] for primary care. Carles Collet, PA-C asked the CCM team to consult the patient for Mental Health Counseling and Resources.      This social worker was unable to reach patient via telephone today for follow up. I have left HIPAA compliant voicemail asking patient to return my call. (unsuccessful outreach #2).   Plan: Will follow-up within 7 business days via telephone.      Elliot Gurney, [place] Worker  [facility] Care Management [phone]

## 2019-07-07 ENCOUNTER — Telehealth: Payer: Self-pay | Admitting: *Deleted

## 2019-07-07 ENCOUNTER — Ambulatory Visit: Payer: Self-pay | Admitting: *Deleted

## 2019-07-07 DIAGNOSIS — F419 Anxiety disorder, unspecified: Secondary | ICD-10-CM

## 2019-07-07 DIAGNOSIS — F339 Major depressive disorder, recurrent, unspecified: Secondary | ICD-10-CM

## 2019-07-07 NOTE — Patient Instructions (Signed)
Thank you allowing the Chronic Care Management Team to be a part of your care! It was a pleasure speaking with you today!  1. Please feel free to call this social worker back as schedule permits regarding your mental health needs.   CCM (Chronic Care Management) Team   Trish Fountain RN, BSN Nurse Care Coordinator  [phone]  Ruben Reason PharmD  Clinical Pharmacist  [phone]   [place], LCSW Clinical Social Worker [phone]  Goals Addressed            This Visit's Progress   . " I would like to get my anxiety under control" on hold (pt-stated)       Current Barriers:  . Limited social support . Mental Health Concerns   Clinical Social Work Clinical Goal(s):  Marland Kitchen Over the next 30 days, client will work with SW to address concerns related to managing her anxiety  Interventions: . Patient interviewed and appropriate assessments performed . Provided mental health counseling and support with regard to managing her anxiety due to her son's recent illness and hospitalization  . Processed patient's feelings regarding her son's recent illness, explored current unpredictable schedule due to her son's treatment and need to place future follow up calls on hold for now . Patient confirmed that she was working on Special educational needs teacher . Explored patient's current focus and priorities emphasizing the use of self care strategies discussed in the past   Patient Self Care Activities:  . Performs ADL's independently . Performs IADL's independently . Calls provider office for new concerns or questions  Please see past updates related to this goal by clicking on the "Past Updates" button in the selected goal           The patient verbalized understanding of instructions provided today and declined a print copy of patient instruction materials.   No further follow up required: patient to call when schedule permits

## 2019-07-07 NOTE — Chronic Care Management (AMB) (Signed)
    Care Management   Unsuccessful Call Note 07/07/2019 Name: Annette Bowers MRN: [id number] DOB: [birth year]  Patient  is a 33 year old female who sees Carles Collet, [place] for primary care. Carles Collet, PA-C asked the CCM team to consult the patient for Mental Health Counseling and Resources.       This Education officer, museum sas unable to reach patient via telephone today for follow up session. I have left HIPAA compliant voicemail asking patient to return my call. (unsuccessful outreach #2).   Plan: Will follow-up within 7 business days via telephone.      Elliot Gurney, [place] Worker  [facility] Care Management [phone]

## 2019-07-07 NOTE — Chronic Care Management (AMB) (Signed)
  Chronic Care Management    Clinical Social Work Follow Up Note  07/07/2019 Name: Annette Bowers MRN: [id number] DOB: 30-May-1986  Annette Bowers is a 33 y.o. year old female who is a primary care patient of Trinna Post, [place]. The CCM team was consulted for assistance with Mental Health Counseling and Resources.   Review of patient status, including review of consultants reports, other relevant assessments, and collaboration with appropriate care team members and the patient's provider was performed as part of comprehensive patient evaluation and provision of chronic care management services.     Goals Addressed            This Visit's Progress   . " I would like to get my anxiety under control" on hold (pt-stated)       Current Barriers:  . Limited social support . Mental Health Concerns   Clinical Social Work Clinical Goal(s):  Marland Kitchen Over the next 30 days, client will work with SW to address concerns related to managing her anxiety  Interventions: . Patient interviewed and appropriate assessments performed . Provided mental health counseling and support with regard to managing her anxiety due to her son's recent illness and hospitalization  . Processed patient's feelings regarding her son's recent illness, explored current unpredictable schedule due to her son's treatment and need to place future follow up calls on hold for now . Patient confirmed that she was working on Special educational needs teacher . Explored patient's current focus and priorities emphasizing the use of self care strategies discussed in the past   Patient Self Care Activities:  . Performs ADL's independently . Performs IADL's independently . Calls provider office for new concerns or questions  Please see past updates related to this goal by clicking on the "Past Updates" button in the selected goal           Follow Up Plan: Client will contact this social worker when needed in the future. Current schedule does not  permit follow up calls at this time    Elliot Gurney, [place] Worker  [facility] Care Management [phone]

## 2019-07-16 ENCOUNTER — Telehealth: Payer: Self-pay

## 2019-09-30 ENCOUNTER — Other Ambulatory Visit: Payer: Self-pay | Admitting: Physician Assistant

## 2019-09-30 DIAGNOSIS — F339 Major depressive disorder, recurrent, unspecified: Secondary | ICD-10-CM

## 2019-09-30 DIAGNOSIS — F419 Anxiety disorder, unspecified: Secondary | ICD-10-CM

## 2019-09-30 MED ORDER — SERTRALINE HCL 100 MG PO TABS: 100.0000 mg | ORAL_TABLET | Freq: Every day | ORAL | 1 refills | Status: DC

## 2019-09-30 NOTE — Telephone Encounter (Signed)
[facility] faxed refill request for the following medications:  sertraline (ZOLOFT) 100 MG tablet   Please advise.  

## 2019-10-01 ENCOUNTER — Other Ambulatory Visit: Payer: Self-pay

## 2019-10-01 MED ORDER — BUSPIRONE HCL 7.5 MG PO TABS: 7.5000 mg | ORAL_TABLET | Freq: Two times a day (BID) | ORAL | 0 refills | Status: AC

## 2021-01-08 ENCOUNTER — Telehealth: Payer: Self-pay | Admitting: Physician Assistant

## 2021-01-08 ENCOUNTER — Encounter: Payer: Self-pay | Admitting: Physician Assistant

## 2021-01-08 ENCOUNTER — Ambulatory Visit: Payer: Self-pay | Admitting: *Deleted

## 2021-01-08 ENCOUNTER — Telehealth (INDEPENDENT_AMBULATORY_CARE_PROVIDER_SITE_OTHER): Payer: Managed Care, Other (non HMO) | Admitting: Physician Assistant

## 2021-01-08 ENCOUNTER — Ambulatory Visit: Payer: Managed Care, Other (non HMO) | Admitting: Physician Assistant

## 2021-01-08 DIAGNOSIS — F4321 Adjustment disorder with depressed mood: Secondary | ICD-10-CM | POA: Diagnosis not present

## 2021-01-08 DIAGNOSIS — F419 Anxiety disorder, unspecified: Secondary | ICD-10-CM

## 2021-01-08 DIAGNOSIS — F339 Major depressive disorder, recurrent, unspecified: Secondary | ICD-10-CM

## 2021-01-08 MED ORDER — SERTRALINE HCL 100 MG PO TABS: ORAL_TABLET | ORAL | 0 refills | Status: DC

## 2021-01-08 MED ORDER — ALPRAZOLAM 0.25 MG PO TABS
0.2500 mg | ORAL_TABLET | Freq: Every evening | ORAL | 0 refills | Status: DC | PRN
Start: 2021-01-08 — End: 2021-01-23

## 2021-01-08 NOTE — Telephone Encounter (Signed)
Patient is calling back for the nurse to triage her before her video visit. CB- [phone]

## 2021-01-08 NOTE — Progress Notes (Signed)
MyChart Video Visit    Virtual Visit via Video Note   This visit type was conducted due to national recommendations for restrictions regarding the COVID-19 Pandemic (e.g. social distancing) in an effort to limit this patient's exposure and mitigate transmission in our community. This patient is at least at moderate risk for complications without adequate follow up. This format is felt to be most appropriate for this patient at this time. Physical exam was limited by quality of the video and audio technology used for the visit.   Patient location: Home Provider location: Office   I discussed the limitations of evaluation and management by telemedicine and the availability of in person appointments. The patient expressed understanding and agreed to proceed.  Patient: Annette Bowers   DOB: 09-Mar-1986   35 y.o. Female  MRN: [id number] Visit Date: 01/08/2021  Today's healthcare provider: Trey Sailors, PA-C   Chief Complaint  Patient presents with  . Depression   Subjective    Depression        This is a new problem.  The current episode started more than 1 month ago.   The onset quality is sudden.   The problem has been gradually worsening since onset.  Associated symptoms include decreased concentration, fatigue, helplessness, hopelessness, insomnia, irritable, restlessness, decreased interest, appetite change, body aches, myalgias, sad and suicidal ideas.  Associated symptoms include no headaches and no indigestion.     The symptoms are aggravated by family issues.  Past treatments include nothing.  Past compliance problems include difficulty with treatment plan.  Past medical history includes anxiety.     Son age 28 died aplastic anemia 2020-07-01. Mother died of COVID on 08/24/21and grandfather died the next day also of COVID. Previous history of depression and anxiety. Living with fiance and father of son, have been together the past 8 years. Her fiance is on dialysis and is on  kidney transplant in the near future. Previously on zoloft and talking with our counselor but had been unable to continue due to illness of son. She has not received any grief counseling or treatment for depression and anxiety since 09/2019. She is working full time from home. After work, she reports significant depression and is bedbound the rest of the night. Has been playing a lot of music for distractions. She report she is eating sometimes. She does not know her current weight. Reports her work with her paternity testing brings up thoughts about her son.  Patient Active Problem List   Diagnosis Date Noted  . Tobacco abuse 08/07/2017  . Vitamin D deficiency 07/08/2017  . Corn of foot 08/01/16  . Hyperglycemia 08/01/16  . Anxiety 06/02/2015  . Headache, migraine 06/02/2015  . Major depression, recurrent, chronic (HCC) 06/02/2015   Past Medical History:  Diagnosis Date  . Allergy   . Anemia   . Anxiety   . Insomnia   . Migraine   . Moderate major depression (HCC)    Allergies  Allergen Reactions  . Promethazine Hcl     drowsy  . Topiramate     drowsy  . Trazodone     groggy      Medications: Outpatient Medications Prior to Visit  Medication Sig  . [DISCONTINUED] sertraline (ZOLOFT) 100 MG tablet Take 1 tablet (100 mg total) by mouth daily.  . [DISCONTINUED] traZODone (DESYREL) 50 MG tablet Take 0.5-1 tablets (25-50 mg total) by mouth at bedtime as needed for sleep. (Patient not taking: Reported on 01/08/2021)  No facility-administered medications prior to visit.    Review of Systems  Constitutional: Positive for appetite change and fatigue.  Musculoskeletal: Positive for myalgias.  Neurological: Negative for headaches.  Psychiatric/Behavioral: Positive for decreased concentration, depression and suicidal ideas. The patient has insomnia.       Objective    There were no vitals taken for this visit.   Physical Exam Constitutional:      General: She is  irritable.     Appearance: Normal appearance.  Pulmonary:     Effort: Pulmonary effort is normal. No respiratory distress.  Neurological:     Mental Status: She is alert.  Psychiatric:        Mood and Affect: Mood is depressed. Affect is flat.        Behavior: Behavior normal.        Assessment & Plan    1. Anxiety  Restart zoloft as below. Will want to see her back in two weeks. She denies active SI. Will place emergent referral for our social worker who has a previous relationship with her. May consider IOP therapy program or very regular outpatient counseling if not able to balance IOP with work. Currently patient is working full time from home and while difficult this seems to provide some stability.   - sertraline (ZOLOFT) 100 MG tablet; Take 50 mg daily for two weeks. Then may increase to 100 mg daily onward.  Dispense: 90 tablet; Refill: 0 - ALPRAZolam (XANAX) 0.25 MG tablet; Take 1 tablet (0.25 mg total) by mouth at bedtime as needed for anxiety.  Dispense: 15 tablet; Refill: 0 - AMB Referral to [facility] Coordinaton  2. Major depression, recurrent, chronic (HCC)  - sertraline (ZOLOFT) 100 MG tablet; Take 50 mg daily for two weeks. Then may increase to 100 mg daily onward.  Dispense: 90 tablet; Refill: 0 - ALPRAZolam (XANAX) 0.25 MG tablet; Take 1 tablet (0.25 mg total) by mouth at bedtime as needed for anxiety.  Dispense: 15 tablet; Refill: 0 - AMB Referral to [facility] Coordinaton  3. Grief  - AMB Referral to [facility] Coordinaton   No follow-ups on file.     I discussed the assessment and treatment plan with the patient. The patient was provided an opportunity to ask questions and all were answered. The patient agreed with the plan and demonstrated an understanding of the instructions.   The patient was advised to call back or seek an in-person evaluation if the symptoms worsen or if the condition fails to improve as anticipated.   ITrey Sailors, PA-C, have reviewed all documentation for this visit. The documentation on 01/08/21 for the exam, diagnosis, procedures, and orders are all accurate and complete.  The entirety of the information documented in the History of Present Illness, Review of Systems and Physical Exam were personally obtained by me. Portions of this information were initially documented by Sheliah Hatch, CMA and reviewed by me for thoroughness and accuracy.       Maryella Shivers [facility] [phone] (phone) [phone] (fax)  [facility]

## 2021-01-09 NOTE — Chronic Care Management (AMB) (Signed)
Care Management Clinical Social Work Note  01/09/2021 Name: SHARNITA BOGUCKI MRN: [id number] DOB: December 17, 1985  Annette Bowers is a 35 y.o. year old female who is a primary care patient of Trey Sailors, [place].  The Care Management team was consulted for assistance with chronic disease management and coordination needs.  Engaged with patient by telephone for follow up visit in response to provider referral for social work chronic care management and care coordination services  Assessment: Review of patient past medical history, allergies, medications, and health status, including review of relevant consultants reports was performed today as part of a comprehensive evaluation and provision of chronic care management and care coordination services.  SDOH (Social Determinants of Health) assessments and interventions performed:    Advanced Directives Status: Not addressed in this encounter.  Care Plan  Allergies  Allergen Reactions  . Promethazine Hcl     drowsy  . Topiramate     drowsy  . Trazodone     groggy    Outpatient Encounter Medications as of 01/08/2021  Medication Sig  . ALPRAZolam (XANAX) 0.25 MG tablet Take 1 tablet (0.25 mg total) by mouth at bedtime as needed for anxiety.  . sertraline (ZOLOFT) 100 MG tablet Take 50 mg daily for two weeks. Then may increase to 100 mg daily onward.   No facility-administered encounter medications on file as of 01/08/2021.    Patient Active Problem List   Diagnosis Date Noted  . Tobacco abuse 08/07/2017  . Vitamin D deficiency 07/08/2017  . Corn of foot 07/25/2016  . Hyperglycemia 07/25/2016  . Anxiety 06/02/2015  . Headache, migraine 06/02/2015  . Major depression, recurrent, chronic (HCC) 06/02/2015    Conditions to be addressed/monitored: Anxiety and Depression; Mental Health Concerns   Care Plan : Depression (Adult)  Updates made by Wenda Overland, LCSW since 01/09/2021 12:00 AM    Problem: Symptoms (Depression)     Goal:  Symptoms Monitored and Managed   Start Date: 01/08/2021  This Visit's Progress: On track  Priority: High  Note:   Current Barriers:  Marland Kitchen Mental Health Concerns  . Grief and loss  Clinical Social Work Clinical Goal(s):  Marland Kitchen Over the next 90 days, patient will follow up with grief support* as directed by SW  Interventions: . 1:1 collaboration with Trey Sailors, PA-C regarding development and update of comprehensive plan of care as evidenced by provider attestation and co-signature . Inter-disciplinary care team collaboration (see longitudinal plan of care) . Patient interviewed and appropriate assessments performed . Patient discussed challenges related to the loss of her son, then mother and grandfather soon after .  Emotional support provided, patient's level of support explored . Discussed referral for grief counseling and provided education to patient        about the grief support program through [facility]osp[facility] Pall[facility]  Contact information provided, patient agreed to call to set up the initial appointment  Patient Goals/Self-Care Activities Over the next 90 days, patient will:   -  Contact [facility]ospice [facility] Pall[facility] to set up an appointment for the grief support program - begin personal counseling - join a support group - start or continue a personal journal - talk about feelings with a friend, family or spiritual advisor  Follow up Plan: SW will follow up with patient by phone over the next 14 business days    Task: Alleviate Barriers to Depression Treatment        Follow Up Plan: SW will follow up with  patient by phone over the next 14 business days   [place], [place] Clinical Social Worker  [facility] Care Management [phone]

## 2021-01-09 NOTE — Patient Instructions (Signed)
Visit Information  PATIENT GOALS:  Goals Addressed            This Visit's Progress   . Manage My Emotions       Timeframe:  Short-Term Goal Priority:  High Start Date:      01/08/21                       Expected End Date:  05/08/21                 Follow Up Date 01/16/2021    - begin personal counseling - join a support group - start or continue a personal journal - talk about feelings with a friend, family or spiritual advisor    Why is this important?    When you are stressed, down or upset, your body reacts too.   For example, your blood pressure may get higher; you may have a headache or stomachache.   When your emotions get the best of you, your body's ability to fight off cold and flu gets weak.   These steps will help you manage your emotions.     Notes:        The patient verbalized understanding of instructions, educational materials, and care plan provided today and declined offer to receive copy of patient instructions, educational materials, and care plan.   Telephone follow up appointment with care management team member scheduled for: 01/16/21   Verna Czech, LCSW Clinical Social Worker  [facility] [phone]

## 2021-01-09 NOTE — Telephone Encounter (Signed)
Appointment was yesterday,01/08/2021

## 2021-01-16 ENCOUNTER — Ambulatory Visit: Payer: Self-pay | Admitting: *Deleted

## 2021-01-16 DIAGNOSIS — F339 Major depressive disorder, recurrent, unspecified: Secondary | ICD-10-CM

## 2021-01-16 DIAGNOSIS — F419 Anxiety disorder, unspecified: Secondary | ICD-10-CM

## 2021-01-16 NOTE — Patient Instructions (Signed)
Visit Information  PATIENT GOALS:  Goals Addressed            This Visit's Progress   . Manage My Emotions       Timeframe:  Short-Term Goal Priority:  High Start Date:      01/08/21                       Expected End Date:  05/08/21                 Follow Up Date 01/16/2021    - begin personal counseling-appt with grief counseling scheduled for 01/18/21 - start or continue a personal journal - talk about feelings with a friend, family or spiritual advisor    Why is this important?    When you are stressed, down or upset, your body reacts too.   For example, your blood pressure may get higher; you may have a headache or stomachache.   When your emotions get the best of you, your body's ability to fight off cold and flu gets weak.   These steps will help you manage your emotions.     Notes:         The patient verbalized understanding of instructions, educational materials, and care plan provided today and declined offer to receive copy of patient instructions, educational materials, and care plan.   Telephone follow up appointment with care management team member scheduled for: 01/25/21    Verna Czech, LCSW Clinical Social Worker  [facility] [phone]

## 2021-01-16 NOTE — Chronic Care Management (AMB) (Signed)
Care Management Clinical Social Work Note  01/16/2021 Name: Annette Bowers MRN: [id number] DOB: 03/12/1986  Annette Bowers is a 35 y.o. year old female who is a primary care patient of Trey Sailors, [place].  The Care Management team was consulted for assistance with chronic disease management and coordination needs.  Engaged with patient by telephone for follow up visit in response to provider referral for social work chronic care management and care coordination  Assessment: Review of patient past medical history, allergies, medications, and health status, including review of relevant consultants reports was performed today as part of a comprehensive evaluation and provision of chronic care management and care coordination services.  SDOH (Social Determinants of Health) assessments and interventions performed:    Advanced Directives Status: Not addressed in this encounter.  Care Plan  Allergies  Allergen Reactions  . Promethazine Hcl     drowsy  . Topiramate     drowsy  . Trazodone     groggy    Outpatient Encounter Medications as of 01/16/2021  Medication Sig  . ALPRAZolam (XANAX) 0.25 MG tablet Take 1 tablet (0.25 mg total) by mouth at bedtime as needed for anxiety.  . sertraline (ZOLOFT) 100 MG tablet Take 50 mg daily for two weeks. Then may increase to 100 mg daily onward.   No facility-administered encounter medications on file as of 01/16/2021.    Patient Active Problem List   Diagnosis Date Noted  . Tobacco abuse 08/07/2017  . Vitamin D deficiency 07/08/2017  . Corn of foot 07/25/2016  . Hyperglycemia 07/25/2016  . Anxiety 06/02/2015  . Headache, migraine 06/02/2015  . Major depression, recurrent, chronic (HCC) 06/02/2015    Conditions to be addressed/monitored: Depression; Mental Health Concerns   Care Plan : Depression (Adult)  Updates made by Wenda Overland, LCSW since 01/16/2021 12:00 AM    Problem: Symptoms (Depression)     Goal: Symptoms Monitored and  Managed   Start Date: 01/08/2021  This Visit's Progress: On track  Recent Progress: On track  Priority: High  Note:   Current Barriers:  Marland Kitchen Mental Health Concerns  . Grief and loss  Clinical Social Work Clinical Goal(s):  Marland Kitchen Over the next 90 days, patient will follow up with grief support* as directed by SW  Interventions: . 1:1 collaboration with Trey Sailors, PA-C regarding development and update of comprehensive plan of care as evidenced by provider attestation and co-signature . Inter-disciplinary care team collaboration (see longitudinal plan of care) . Patient interviewed and appropriate assessments performed . Patient continues to discuss emotional challenges related to the loss of her son, then mother and grandfather soon after . Patient confirmed scheduling grief counseling, appointment scheduled for 01/18/21 during her lunch break .  Emotional support, positive reinforcement provided for motivation to practice self care and learning ways to cope .           Patient Goals/Self-Care Activities Over the next 90 days, patient will:   -  Contact [facility]ospice [facility] Palliative Care to set up an appointment for the grief support program - begin personal counseling - join a support group - start or continue a personal journal - talk about feelings with a friend, family or spiritual advisor  Follow up Plan: SW will follow up with patient by phone over the next 14 business days       Follow Up Plan: SW will follow up with patient by phone over the next 14 business days     [employer],  LCSW Clinical Social Worker  [facility] Care [facility] [phone]

## 2021-01-22 ENCOUNTER — Telehealth (INDEPENDENT_AMBULATORY_CARE_PROVIDER_SITE_OTHER): Payer: Managed Care, Other (non HMO) | Admitting: Physician Assistant

## 2021-01-22 ENCOUNTER — Encounter: Payer: Self-pay | Admitting: Physician Assistant

## 2021-01-22 DIAGNOSIS — F419 Anxiety disorder, unspecified: Secondary | ICD-10-CM | POA: Diagnosis not present

## 2021-01-22 DIAGNOSIS — F339 Major depressive disorder, recurrent, unspecified: Secondary | ICD-10-CM | POA: Diagnosis not present

## 2021-01-22 NOTE — Progress Notes (Signed)
MyChart Video Visit    Virtual Visit via Video Note   This visit type was conducted due to national recommendations for restrictions regarding the COVID-19 Pandemic (e.g. social distancing) in an effort to limit this patient's exposure and mitigate transmission in our community. This patient is at least at moderate risk for complications without adequate follow up. This format is felt to be most appropriate for this patient at this time. Physical exam was limited by quality of the video and audio technology used for the visit.   Patient location: Home Provider location: Office   I discussed the limitations of evaluation and management by telemedicine and the availability of in person appointments. The patient expressed understanding and agreed to proceed.  Patient: Annette Bowers   DOB: 1986/11/27   35 y.o. Female  MRN: [id number] Visit Date: 01/22/2021  Today's healthcare provider: Trey Sailors, PA-C   Chief Complaint  Patient presents with  . Anxiety  . Depression  I,Adriana M Pollak,acting as a scribe for Trey Sailors, PA-C.,have documented all relevant documentation on the behalf of Trey Sailors, PA-C,as directed by  Trey Sailors, PA-C while in the presence of Trey Sailors, PA-C.  Subjective    HPI  Anxiety & Depression, Follow-up  She was last seen for anxiety and depression 2 weeks ago. Changes made at last visit include restarted zoloft 100 MG. She reports her mood is about the same since starting her medications. She is having emotional highs and lows. She is using Xanax 0.25 mg daily PRN for anxiety and depression.    She reports good compliance with treatment. She reports good tolerance of treatment. She is not having side effects.   She feels her anxiety is severe and Unchanged since last visit. She has been working with our Medical illustrator. One of her goals was to set up counseling with Hospice and Palliative care. She had an individual  counseling session last Thursday. She feels hopeful about attending this encounter. She will meet with the counselor every Wednesday. She has homework assignments. She has asked off of work to attend these sessions and plans to attend these meetings regularly. Hospice has not brought up a support group at this point.   She reports she is very busy still working from home. She reports there have been some talks about rotating staff back through in person at [employer].   She has seen some family this weekend, aunt recently discharged from the hospital. She visited her aunt at her house after discharged from hospital. This is her mom's sister. She has a very close relationship her aunt and enjoyed doing this. Her aunt is recovering better than expected from month long illness.   Symptoms: No chest pain Yes difficulty concentrating  No dizziness No fatigue  No feelings of losing control Yes insomnia  Yes irritable Yes palpitations  Yes panic attacks Yes racing thoughts  No shortness of breath Yes sweating  Yes tremors/shakes    GAD-7 Results GAD-7 Generalized Anxiety Disorder Screening Tool 01/22/2021  1. Feeling Nervous, Anxious, or on Edge 3  2. Not Being Able to Stop or Control Worrying 3  3. Worrying Too Much About Different Things 3  4. Trouble Relaxing 3  5. Being So Restless it's Hard To Sit Still 0  6. Becoming Easily Annoyed or Irritable 3  7. Feeling Afraid As If Something Awful Might Happen 3  Total GAD-7 Score 18  Difficulty At Work, Home, or Getting  Along With Others? Very difficult    PHQ-9 Scores PHQ9 SCORE ONLY 01/22/2021 04/21/2019 02/24/2019  PHQ-9 Total Score 17 14 22     ---------------------------------------------------------------------------------------------------      Medications: Outpatient Medications Prior to Visit  Medication Sig  . sertraline (ZOLOFT) 100 MG tablet Take 50 mg daily for two weeks. Then may increase to 100 mg daily onward.  .  [DISCONTINUED] ALPRAZolam (XANAX) 0.25 MG tablet Take 1 tablet (0.25 mg total) by mouth at bedtime as needed for anxiety.   No facility-administered medications prior to visit.    Review of Systems    Objective    There were no vitals taken for this visit.   Physical Exam Constitutional:      Appearance: Normal appearance.  Pulmonary:     Effort: Pulmonary effort is normal. No respiratory distress.  Neurological:     Mental Status: She is alert.  Psychiatric:        Mood and Affect: Mood is depressed. Affect is flat.        Behavior: Behavior normal.        Assessment & Plan    1. Major depression, recurrent, chronic (HCC)  Continue zoloft, she is tolerating well. Patient is feeling more hopeful having established with Hospice in counseling. She is using Xanax PRN. Refill this. She is still reporting difficulty sleeping. Follow up in two weeks.   2. Anxiety  Refill Xanax.    Return in about 2 weeks (around 02/05/2021), or if symptoms worsen or fail to improve.     I discussed the assessment and treatment plan with the patient. The patient was provided an opportunity to ask questions and all were answered. The patient agreed with the plan and demonstrated an understanding of the instructions.   The patient was advised to call back or seek an in-person evaluation if the symptoms worsen or if the condition fails to improve as anticipated.   I3/01/2021, PA-C, have reviewed all documentation for this visit. The documentation on 01/23/21 for the exam, diagnosis, procedures, and orders are all accurate and complete.  The entirety of the information documented in the History of Present Illness, Review of Systems and Physical Exam were personally obtained by me. Portions of this information were initially documented by [facility] and reviewed by me for thoroughness and accuracy.    [facility] [phone]  (phone) [phone] (fax)  [facility]

## 2021-01-23 MED ORDER — ALPRAZOLAM 0.25 MG PO TABS: 0.2500 mg | ORAL_TABLET | Freq: Every evening | ORAL | 0 refills | Status: DC | PRN

## 2021-01-25 ENCOUNTER — Telehealth: Payer: Self-pay | Admitting: *Deleted

## 2021-02-01 ENCOUNTER — Ambulatory Visit: Payer: Self-pay | Admitting: *Deleted

## 2021-02-01 DIAGNOSIS — F339 Major depressive disorder, recurrent, unspecified: Secondary | ICD-10-CM

## 2021-02-01 DIAGNOSIS — F4321 Adjustment disorder with depressed mood: Secondary | ICD-10-CM

## 2021-02-01 DIAGNOSIS — F419 Anxiety disorder, unspecified: Secondary | ICD-10-CM

## 2021-02-02 NOTE — Patient Instructions (Addendum)
Visit Information  Goals Addressed            This Visit's Progress   . Manage My Emotions       Timeframe:  Short-Term Goal Priority:  High Start Date:      01/08/21                       Expected End Date:  05/08/21                 Follow Up Date 02/02/2021    - continue personal grief counseling-weekly - start or continue a personal journal - talk about feelings with a friend, family or spiritual advisor    Why is this important?    When you are stressed, down or upset, your body reacts too.   For example, your blood pressure may get higher; you may have a headache or stomachache.   When your emotions get the best of you, your body's ability to fight off cold and flu gets weak.   These steps will help you manage your emotions.     Notes:        The patient verbalized understanding of instructions, educational materials, and care plan provided today and declined offer to receive copy of patient instructions, educational materials, and care plan.   No further follow up required: patient to contact this Child psychotherapist with any additional community resource needs   [employer], LCSW Clinical Social Worker  [facility] [phone]

## 2021-02-02 NOTE — Chronic Care Management (AMB) (Signed)
Care Management Clinical Social Work Note  02/02/2021 Name: Annette Bowers MRN: [id number] DOB: 1985/12/14  Annette Bowers is a 35 y.o. year old female who is a primary care patient of Trey Sailors, [place].  The Care Management team was consulted for assistance with chronic disease management and coordination needs.  Engaged with patient by telephone for follow up visit in response to provider referral for social work chronic care management and care coordination services  Assessment: Review of patient past medical history, allergies, medications, and health status, including review of relevant consultants reports was performed today as part of a comprehensive evaluation and provision of chronic care management and care coordination services.  SDOH (Social Determinants of Health) assessments and interventions performed:    Advanced Directives Status: Not addressed in this encounter.  Care Plan  Allergies  Allergen Reactions  . Promethazine Hcl     drowsy  . Topiramate     drowsy  . Trazodone     groggy    Outpatient Encounter Medications as of 02/01/2021  Medication Sig  . ALPRAZolam (XANAX) 0.25 MG tablet Take 1 tablet (0.25 mg total) by mouth at bedtime as needed for anxiety.  . sertraline (ZOLOFT) 100 MG tablet Take 50 mg daily for two weeks. Then may increase to 100 mg daily onward.   No facility-administered encounter medications on file as of 02/01/2021.    Patient Active Problem List   Diagnosis Date Noted  . Tobacco abuse 08/07/2017  . Vitamin D deficiency 07/08/2017  . Corn of foot 07/25/2016  . Hyperglycemia 07/25/2016  . Anxiety 06/02/2015  . Headache, migraine 06/02/2015  . Major depression, recurrent, chronic (HCC) 06/02/2015    Conditions to be addressed/monitored:  Mental Health Concerns   Care Plan : Depression (Adult)  Updates made by Wenda Overland, LCSW since 02/02/2021 12:00 AM    Problem: Symptoms (Depression)     Goal: Symptoms Monitored  and Managed   Start Date: 01/08/2021  Recent Progress: On track  Priority: High  Note:   Current Barriers:  Marland Kitchen Mental Health Concerns  . Grief and loss  Clinical Social Work Clinical Goal(s):  Marland Kitchen Over the next 90 days, patient will follow up with grief support* as directed by SW  Interventions: . 1:1 collaboration with Trey Sailors, PA-C regarding development and update of comprehensive plan of care as evidenced by provider attestation and co-signature . Inter-disciplinary care team collaboration (see longitudinal plan of care) . Patient interviewed and appropriate assessments performed . Patient continues to discuss emotional challenges related to the loss of her son, then mother and grandfather soon after . Patient confirms follow up with grief counseling weekly, next appointment 02/02/21 . Patient confirmed feeling that the grief counseling is beneficial and plans to continue .  Emotional support, positive reinforcement provided for motivation and to practice self care and learning ways to cope with grief           Patient Goals/Self-Care Activities Over the next 90 days, patient will:   -  Contact Hospice and Palliative Care to set up an appointment for the grief support program - begin personal counseling - join a support group - start or continue a personal journal - talk about feelings with a friend, family or spiritual advisor  Follow up Plan: SW will follow up with patient by phone over the next 14 business days       Follow Up Plan: Client will contact this social worker with any additional community resource  needs   Verna Czech, LCSW Clinical Social Worker  [facility] Care [facility] [phone]

## 2021-02-05 ENCOUNTER — Telehealth: Payer: Managed Care, Other (non HMO) | Admitting: Physician Assistant

## 2021-02-05 ENCOUNTER — Encounter: Payer: Self-pay | Admitting: Physician Assistant

## 2021-02-05 DIAGNOSIS — F339 Major depressive disorder, recurrent, unspecified: Secondary | ICD-10-CM | POA: Diagnosis not present

## 2021-02-05 DIAGNOSIS — F4321 Adjustment disorder with depressed mood: Secondary | ICD-10-CM

## 2021-02-05 DIAGNOSIS — F419 Anxiety disorder, unspecified: Secondary | ICD-10-CM | POA: Diagnosis not present

## 2021-02-05 MED ORDER — ALPRAZOLAM 0.25 MG PO TABS: 0.2500 mg | ORAL_TABLET | Freq: Every evening | ORAL | 1 refills | Status: DC | PRN

## 2021-02-05 NOTE — Progress Notes (Addendum)
Established patient visit   Patient: Annette Bowers   DOB: [birth year]   34 y.o. Female  MRN: [id number] Visit Date: 02/05/2021  Today's healthcare provider: Trey Sailors, PA-C   Chief Complaint  Patient presents with  . Depression   Virtual Visit via Video Note  I connected with Annette Bowers on 02/07/21 at  2:20 PM EST by a video enabled telemedicine application and verified that I am speaking with the correct person using two identifiers.  Location: Patient: Home Provider: Office    I discussed the limitations of evaluation and management by telemedicine and the availability of in person appointments. The patient expressed understanding and agreed to proceed.  Subjective    HPI  Depression, Follow-up  She  was last seen for this 2 weeks ago. Changes made at last visit include continue zoloft 50 mg QD for two weeks and then increase to zooft 100 mg daily onward. Continues weekly counseling with MJ from grief counseling. She is also talking with our therapist Chrystal. She has only taken 50 mg zoloft daily for right now.    Patient's husband continues to undergo peritoneal dialysis and has been approved for transplant. Sister qualifies as living donor.   Reports one of her first assignments was eating and trying to stay on a regular diet. She reports she has received some counseling on insomnia. She reports eating habits are better. She is not currently weighing herself every day but reports she is eating more consistently and frequently.   She is using Xanax 0.25 mg at night as needed.    She reports good compliance with treatment. She is not having side effects.   She reports good tolerance of treatment. Current symptoms include: depressed mood, insomnia and psychomotor retardation She feels she is improved since last visit.  Depression screen [facility] 2/9 01/22/2021 04/21/2019 02/24/2019  Decreased Interest 2 1 3   Down, Depressed, Hopeless 3 3 3   PHQ - 2 Score 5  4 6   Altered sleeping 2 2 2   Tired, decreased energy 2 1 3   Change in appetite 3 2 3   Feeling bad or failure about yourself  3 3 3   Trouble concentrating 2 1 1   Moving slowly or fidgety/restless 0 1 2  Suicidal thoughts 0 0 2  PHQ-9 Score 17 14 22   Difficult doing work/chores Somewhat difficult Somewhat difficult Extremely dIfficult    -----------------------------------------------------------------------------------------      Medications: Outpatient Medications Prior to Visit  Medication Sig  . sertraline (ZOLOFT) 100 MG tablet Take 50 mg daily for two weeks. Then may increase to 100 mg daily onward.  . [DISCONTINUED] ALPRAZolam (XANAX) 0.25 MG tablet Take 1 tablet (0.25 mg total) by mouth at bedtime as needed for anxiety.   No facility-administered medications prior to visit.    Review of Systems     Objective    There were no vitals taken for this visit.    Physical Exam Constitutional:      Appearance: Normal appearance.  Skin:    General: Skin is warm and dry.  Neurological:     Mental Status: She is alert and oriented to person, place, and time. Mental status is at baseline.  Psychiatric:        Mood and Affect: Mood is depressed.       No results found for any visits on 02/05/21.  Assessment & Plan    1. Major depression, recurrent, chronic (HCC)  Increase to zoloft 100 mg daily,  would like to see more improvement from the medication. She has been having some more difficulty with motivation lately. Follow up in 2 weeks. Did let her know I was transitioning out of the practice, we will make sure her care is not interrupted.   - ALPRAZolam (XANAX) 0.25 MG tablet; Take 1 tablet (0.25 mg total) by mouth at bedtime as needed for anxiety.  Dispense: 30 tablet; Refill: 1  2. Grief   3. Anxiety  - ALPRAZolam (XANAX) 0.25 MG tablet; Take 1 tablet (0.25 mg total) by mouth at bedtime as needed for anxiety.  Dispense: 30 tablet; Refill: 1   Return in about  2 weeks (around 02/19/2021).      ITrey Sailors, PA-C, have reviewed all documentation for this visit. The documentation on 02/05/21 for the exam, diagnosis, procedures, and orders are all accurate and complete.  The entirety of the information documented in the History of Present Illness, Review of Systems and Physical Exam were personally obtained by me. Portions of this information were initially documented by [facility] and reviewed by me for thoroughness and accuracy.     Annette Bowers  [facility] [phone] (phone) [phone] (fax)  [facility]

## 2021-02-19 ENCOUNTER — Telehealth (INDEPENDENT_AMBULATORY_CARE_PROVIDER_SITE_OTHER): Payer: Managed Care, Other (non HMO) | Admitting: Physician Assistant

## 2021-02-19 DIAGNOSIS — F339 Major depressive disorder, recurrent, unspecified: Secondary | ICD-10-CM

## 2021-02-19 DIAGNOSIS — F419 Anxiety disorder, unspecified: Secondary | ICD-10-CM

## 2021-02-19 NOTE — Patient Instructions (Signed)

## 2021-02-19 NOTE — Progress Notes (Signed)
MyChart Video Visit    Virtual Visit via Video Note   This visit type was conducted due to national recommendations for restrictions regarding the COVID-19 Pandemic (e.g. social distancing) in an effort to limit this patient's exposure and mitigate transmission in our community. This patient is at least at moderate risk for complications without adequate follow up. This format is felt to be most appropriate for this patient at this time. Physical exam was limited by quality of the video and audio technology used for the visit.   Patient location: Home Provider location: Office   I discussed the limitations of evaluation and management by telemedicine and the availability of in person appointments. The patient expressed understanding and agreed to proceed.  Patient: Annette Bowers   DOB: Mar 12, 1986   35 y.o. Female  MRN: [id number] Visit Date: 02/19/2021  Today's healthcare provider: Trey Sailors, PA-C   Chief Complaint  Patient presents with  . Depression   Subjective    HPI  Follow up for Depression  The patient was last seen for this 2 weeks ago. Changes made at last visit include Increasing zoloft to 100 mg daily. She continues with Xanax PRN. She reports some insomnia due to panic attacks.   She reports excellent compliance with treatment. She feels that condition is Unchanged. She is not having side effects.   She has an official date for kidney transplant for her partner on 06/06/2021. His sister is a living donor and will be providing organ. Patient reports this is an added stressor since last time. She thinks she may need to navigate a new position with her job as it has seem to become too much.   -----------------------------------------------------------------------------------------    Patient Active Problem List   Diagnosis Date Noted  . Tobacco abuse 08/07/2017  . Vitamin D deficiency 07/08/2017  . Corn of foot 07/25/2016  . Hyperglycemia 07/25/2016   . Anxiety 06/02/2015  . Headache, migraine 06/02/2015  . Major depression, recurrent, chronic (HCC) 06/02/2015   Past Medical History:  Diagnosis Date  . Allergy   . Anemia   . Anxiety   . Insomnia   . Migraine   . Moderate major depression (HCC)    Social History   Tobacco Use  . Smoking status: Current Every Day Smoker    Packs/day: 0.50    Years: 8.00    Pack years: 4.00    Types: Cigarettes  . Smokeless tobacco: Never Used  Vaping Use  . Vaping Use: Former  Substance Use Topics  . Alcohol use: Yes    Alcohol/week: 3.0 - 4.0 standard drinks    Types: 3 - 4 Glasses of wine per week  . Drug use: No   Allergies  Allergen Reactions  . Promethazine Hcl     drowsy  . Topiramate     drowsy  . Trazodone     groggy    Medications: Outpatient Medications Prior to Visit  Medication Sig  . ALPRAZolam (XANAX) 0.25 MG tablet Take 1 tablet (0.25 mg total) by mouth at bedtime as needed for anxiety.  . sertraline (ZOLOFT) 100 MG tablet Take 50 mg daily for two weeks. Then may increase to 100 mg daily onward.   No facility-administered medications prior to visit.    Review of Systems  Constitutional: Negative.   Respiratory: Negative.   Gastrointestinal: Negative.   Psychiatric/Behavioral: Positive for decreased concentration, dysphoric mood and sleep disturbance. Negative for agitation, self-injury and suicidal ideas. The patient is nervous/anxious.  Objective    There were no vitals taken for this visit.   Physical Exam Constitutional:      Appearance: Normal appearance.  Pulmonary:     Effort: Pulmonary effort is normal. No respiratory distress.  Neurological:     Mental Status: She is alert.  Psychiatric:        Mood and Affect: Mood normal.        Behavior: Behavior normal.        Assessment & Plan    1. Anxiety  Continue zoloft 100 mg daily and Xanax PRN. She is dealing with some added stressors of her partner undergoing kidney transplant  this June. We will allow this dose of zoloft some more time to work and she will continue counseling. She does continue to have insmonia but would like to see how the zoloft takes effect. Follow up 2-4 weeks.   2. Major depression, recurrent, chronic (HCC)    No follow-ups on file.     I discussed the assessment and treatment plan with the patient. The patient was provided an opportunity to ask questions and all were answered. The patient agreed with the plan and demonstrated an understanding of the instructions.   The patient was advised to call back or seek an in-person evaluation if the symptoms worsen or if the condition fails to improve as anticipated.   ITrey Sailors, PA-C, have reviewed all documentation for this visit. The documentation on 02/19/21 for the exam, diagnosis, procedures, and orders are all accurate and complete.  The entirety of the information documented in the History of Present Illness, Review of Systems and Physical Exam were personally obtained by me. Portions of this information were initially documented by [facility] and reviewed by me for thoroughness and accuracy.    Maryella Shivers [facility] [phone] (phone) [phone] (fax)  [facility]

## 2021-03-27 ENCOUNTER — Telehealth: Payer: Managed Care, Other (non HMO) | Admitting: Family Medicine

## 2021-03-27 ENCOUNTER — Telehealth (INDEPENDENT_AMBULATORY_CARE_PROVIDER_SITE_OTHER): Payer: Managed Care, Other (non HMO) | Admitting: Family Medicine

## 2021-03-27 ENCOUNTER — Encounter: Payer: Self-pay | Admitting: Family Medicine

## 2021-03-27 DIAGNOSIS — F419 Anxiety disorder, unspecified: Secondary | ICD-10-CM | POA: Diagnosis not present

## 2021-03-27 DIAGNOSIS — F339 Major depressive disorder, recurrent, unspecified: Secondary | ICD-10-CM | POA: Diagnosis not present

## 2021-03-27 MED ORDER — ALPRAZOLAM 0.25 MG PO TABS: 0.2500 mg | ORAL_TABLET | Freq: Every evening | ORAL | 1 refills | Status: DC | PRN

## 2021-03-27 MED ORDER — SERTRALINE HCL 100 MG PO TABS: 150.0000 mg | ORAL_TABLET | Freq: Every day | ORAL | 0 refills | Status: DC

## 2021-03-27 NOTE — Progress Notes (Signed)
MyChart Video Visit    Virtual Visit via Video Note   This visit type was conducted due to national recommendations for restrictions regarding the COVID-19 Pandemic (e.g. social distancing) in an effort to limit this patient's exposure and mitigate transmission in our community. This patient is at least at moderate risk for complications without adequate follow up. This format is felt to be most appropriate for this patient at this time. Physical exam was limited by quality of the video and audio technology used for the visit.   Patient location: Home No family members present Provider location: Work   I discussed the limitations of evaluation and management by telemedicine and the availability of in person appointments. The patient expressed understanding and agreed to proceed.  Patient: Annette Bowers   DOB: 1986/05/10   35 y.o. Female  MRN: [id number] Visit Date: 03/27/2021  Today's healthcare provider: Azalee Course Lailani Tool, FNP   Chief Complaint  Patient presents with  . Anxiety  . Depression   I,Porsha C McClurkin,acting as a Neurosurgeon for [employer], FNP.,have documented all relevant documentation on the behalf of Azalee Course Toya Palacios, FNP,as directed by  Azalee Course Ryann Leavitt, FNP while in the presence of Azalee Course Marleta Lapierre, FNP.  Subjective    HPI  Anxiety and Depression, Follow-up  She was last seen for anxiety 1 months ago. Changes made at last visit include None   She reports excellent compliance with treatment. She reports excellent tolerance of treatment. She is not having side effects.   She feels her anxiety is severe and Worse since last visit.  Has been going to counseling and coming up on one year anniversary of son and mothers death Feels like medication helps but still has a hard time concentrating and worrying Does still have panic attacks Does feel like an increase in medication may help Partner having a kidney transplant in June Taking the xanax at night for sleep, this  does help Is hard because she still wakes up frequently at night   Symptoms: No chest pain Yes difficulty concentrating  No dizziness Yes fatigue  Yes feelings of losing control Yes insomnia  Yes irritable Yes palpitations  Yes panic attacks Yes racing thoughts  Yes shortness of breath Yes sweating  Yes tremors/shakes    GAD-7 Results GAD-7 Generalized Anxiety Disorder Screening Tool 03/27/2021 01/22/2021  1. Feeling Nervous, Anxious, or on Edge 3 3  2. Not Being Able to Stop or Control Worrying 3 3  3. Worrying Too Much About Different Things 3 3  4. Trouble Relaxing 3 3  5. Being So Restless it's Hard To Sit Still 0 0  6. Becoming Easily Annoyed or Irritable 3 3  7. Feeling Afraid As If Something Awful Might Happen 3 3  Total GAD-7 Score 18 18  Difficulty At Work, Home, or Getting  Along With Others? Somewhat difficult Very difficult    PHQ-9 Scores PHQ9 SCORE ONLY 03/27/2021 01/22/2021 04/21/2019  PHQ-9 Total Score 17 17 14     ---------------------------------------------------------------------------------------------------   Patient Active Problem List   Diagnosis Date Noted  . Tobacco abuse 08/07/2017  . Vitamin D deficiency 07/08/2017  . Corn of foot 07/25/2016  . Hyperglycemia 07/25/2016  . Anxiety 06/02/2015  . Headache, migraine 06/02/2015  . Major depression, recurrent, chronic (HCC) 06/02/2015   Past Medical History:  Diagnosis Date  . Allergy   . Anemia   . Anxiety   . Insomnia   . Migraine   . Moderate major depression (  HCC)    Social History   Tobacco Use  . Smoking status: Current Every Day Smoker    Packs/day: 0.50    Years: 8.00    Pack years: 4.00    Types: Cigarettes  . Smokeless tobacco: Never Used  Vaping Use  . Vaping Use: Former  Substance Use Topics  . Alcohol use: Yes    Alcohol/week: 3.0 - 4.0 standard drinks    Types: 3 - 4 Glasses of wine per week  . Drug use: No      Medications: Outpatient Medications Prior to Visit   Medication Sig  . ALPRAZolam (XANAX) 0.25 MG tablet Take 1 tablet (0.25 mg total) by mouth at bedtime as needed for anxiety.  . [DISCONTINUED] sertraline (ZOLOFT) 100 MG tablet Take 50 mg daily for two weeks. Then may increase to 100 mg daily onward.   No facility-administered medications prior to visit.    Review of Systems  Psychiatric/Behavioral: Positive for decreased concentration. Negative for suicidal ideas. The patient is nervous/anxious.     Last metabolic panel Lab Results  Component Value Date   GLUCOSE 97 01/21/2017   NA 139 01/21/2017   K 4.4 01/21/2017   CL 100 01/21/2017   CO2 24 01/21/2017   BUN 9 01/21/2017   CREATININE 0.63 01/21/2017   GFRNONAA 122 01/21/2017   GFRAA 141 01/21/2017   CALCIUM 8.9 01/21/2017   PROT 7.2 01/21/2017   ALBUMIN 4.6 01/21/2017   LABGLOB 2.6 01/21/2017   AGRATIO 1.8 01/21/2017   BILITOT 0.3 01/21/2017   ALKPHOS 46 01/21/2017   AST 14 01/21/2017   ALT 12 01/21/2017   ANIONGAP 6 (L) 09/01/2013   Last thyroid functions Lab Results  Component Value Date   TSH 0.762 01/21/2017      Objective    There were no vitals taken for this visit. BP Readings from Last 3 Encounters:  02/24/19 122/86  10/20/17 110/72  08/07/17 102/75      Physical Exam   Constitutional:      General: She is not in acute distress.    Appearance: Normal appearance. She is not ill-appearing.   Pulmonary:     Effort: Pulmonary effort is normal. No respiratory distress.  Neurological:     Mental Status: She is alert and oriented to person, place, and time.  Psychiatric:        Mood and Affect: Mood normal.        Behavior: Behavior normal.     Assessment & Plan     Problem List Items Addressed This Visit      Other   Anxiety   Relevant Medications   sertraline (ZOLOFT) 100 MG tablet   Other Relevant Orders   Ambulatory referral to Psychiatry   Major depression, recurrent, chronic (HCC)   Relevant Medications   sertraline (ZOLOFT) 100  MG tablet   Other Relevant Orders   Ambulatory referral to Psychiatry     Plan  Due to minimal improvement seen with medication changes discussed a referral to psychiatry for a second opinion Increased zoloft from 100mg  to 150 Sent refills of xanax. pdmp reviewed. Continue counseling. Safety numbers provided.  Return in about 6 weeks (around 05/08/2021).     I discussed the assessment and treatment plan with the patient. The patient was provided an opportunity to ask questions and all were answered. The patient agreed with the plan and demonstrated an understanding of the instructions.   The patient was advised to call back or seek an in-person  evaluation if the symptoms worsen or if the condition fails to improve as anticipated.   Azalee Course Tommy Goostree, FNP [facility] [phone] (phone) [phone] (fax)  [facility]

## 2021-03-27 NOTE — Patient Instructions (Signed)
Major Depressive Disorder, Adult Major depressive disorder (MDD) is a mental health condition. It may also be called clinical depression or unipolar depression. MDD causes symptoms of sadness, hopelessness, and loss of interest in things. These symptoms last most of the day, almost every day, for 2 weeks. MDD can also cause physical symptoms. It can interfere with relationships and with everyday activities, such as work, school, and activities that are usually pleasant. MDD may be mild, moderate, or severe. It may be single-episode MDD, which happens once, or recurrent MDD, which may occur multiple times. What are the causes? The exact cause of this condition is not known. MDD is most likely caused by a combination of things, which may include:  Your personality traits.  Learned or conditioned behaviors or thoughts or feelings that reinforce negativity.  Any alcohol or substance misuse.  Long-term (chronic) physical or mental health illness.  Going through a traumatic experience or major life changes. What increases the risk? The following factors may make someone more likely to develop MDD:  A family history of depression.  Being a woman.  Troubled family relationships.  Abnormally low levels of certain brain chemicals.  Traumatic or painful events in childhood, especially abuse or loss of a parent.  A lot of stress from life experiences, such as poor living conditions or discrimination.  Chronic physical illness or other mental health disorders. What are the signs or symptoms? The main symptoms of MDD usually include:  Constant depressed or irritable mood.  A loss of interest in things and activities. Other symptoms include:  Sleeping or eating too much or too little.  Unexplained weight gain or weight loss.  Tiredness or low energy.  Being agitated, restless, or weak.  Feeling hopeless, worthless, or guilty.  Trouble thinking clearly or making  decisions.  Thoughts of suicide or thoughts of harming others.  Isolating oneself or avoiding other people or activities.  Trouble completing tasks, work, or any normal obligations. Severe symptoms of this condition may include:  Psychotic depression.This may include false beliefs, or delusions. It may also include seeing, hearing, tasting, smelling, or feeling things that are not real (hallucinations).  Chronic depression or persistent depressive disorder. This is low-level depression that lasts for at least 2 years.  Melancholic depression, or feeling extremely sad and hopeless.  Catatonic depression, which includes trouble speaking and trouble moving. How is this diagnosed? This condition may be diagnosed based on:  Your symptoms.  Your medical and mental health history. You may be asked questions about your lifestyle, including any drug and alcohol use.  A physical exam.  Blood tests to rule out other conditions. MDD is confirmed if you have the following symptoms most of the day, nearly every day, in a 2-week period:  Either a depressed mood or loss of interest.  At least four other MDD symptoms. How is this treated? This condition is usually treated by mental health professionals, such as psychologists, psychiatrists, and clinical social workers. You may need more than one type of treatment. Treatment may include:  Psychotherapy, also called talk therapy or counseling. Types of psychotherapy include: ? Cognitive behavioral therapy (CBT). This teaches you to recognize unhealthy feelings, thoughts, and behaviors, and replace them with positive thoughts and actions. ? Interpersonal therapy (IPT). This helps you to improve the way you communicate with others or relate to them. ? Family therapy. This treatment includes members of your family.  Medicines to treat anxiety and depression. These medicines help to balance the brain chemicals   that affect your emotions.  Lifestyle  changes. You may be asked to: ? Limit alcohol use and avoid drug use. ? Get regular exercise. ? Get plenty of sleep. ? Make healthy eating choices. ? Spend more time outdoors.  Brain stimulation. This may be done if symptoms are very severe and other treatments have not worked. Examples of this treatment are electroconvulsive therapy and transcranial magnetic stimulation. Follow these instructions at home: Activity  Exercise regularly and spend time outdoors.  Find activities that you enjoy doing, and make time to do them.  Find healthy ways to manage stress, such as: ? Meditation or deep breathing. ? Spending time in nature. ? Journaling.  Return to your normal activities as told by your health care provider. Ask your health care provider what activities are safe for you. Alcohol and drug use  If you drink alcohol: ? Limit how much you use to:  0-1 drink a day for women who are not pregnant.  0-2 drinks a day for men. ? Be aware of how much alcohol is in your drink. In the [place] one drink equals one 12 oz bottle of beer (355 mL), one 5 oz glass of wine (148 mL), or one 1 oz glass of hard liquor (44 mL). ? Discuss your alcohol use with your health care provider. Alcohol can affect any antidepressant medicines you are taking.  Discuss any drug use with your health care provider. General instructions  Take over-the-counter and prescription medicines only as told by your health care provider.  Eat a healthy diet and get plenty of sleep.  Consider joining a support group. Your health care provider may be able to recommend one.  Keep all follow-up visits as told by your health care provider. This is important.   Where to find more information  National Alliance on Mental Illness: www.nami.org  [place] National Institute of Mental Health: www.nimh.nih.gov Contact a health care provider if:  Your symptoms get worse.  You develop new symptoms. Get help right away if:  You  self-harm.  You have serious thoughts about hurting yourself or others.  You hallucinate. If you ever feel like you may hurt yourself or others, or have thoughts about taking your own life, get help right away. Go to your nearest emergency department or:  Call your local emergency services (911 in the [place]  Call a suicide crisis helpline, such as the National Suicide Prevention Lifeline at 1-800-273-8255. This is open 24 hours a day in the [place]  Text the Crisis Text Line at 741741 (in the [place] Summary  Major depressive disorder (MDD) is a mental health condition. MDD causes symptoms of sadness, hopelessness, and loss of interest in things. These symptoms last most of the day, almost every day, for 2 weeks.  The symptoms of MDD can interfere with relationships and with everyday activities.  Treatments and support are available for people who develop MDD. You may need more than one type of treatment.  Get help right away if you have serious thoughts about hurting yourself or others. This information is not intended to replace advice given to you by your health care provider. Make sure you discuss any questions you have with your health care provider. Document Revised: 11/06/2019 Document Reviewed: 11/06/2019 Elsevier Patient Education  2021 Elsevier Inc.  

## 2021-04-04 ENCOUNTER — Other Ambulatory Visit: Payer: Self-pay

## 2021-04-04 ENCOUNTER — Telehealth: Payer: Self-pay | Admitting: Family Medicine

## 2021-04-04 DIAGNOSIS — F419 Anxiety disorder, unspecified: Secondary | ICD-10-CM

## 2021-04-04 DIAGNOSIS — F339 Major depressive disorder, recurrent, unspecified: Secondary | ICD-10-CM

## 2021-04-04 MED ORDER — SERTRALINE HCL 100 MG PO TABS: 150.0000 mg | ORAL_TABLET | Freq: Every day | ORAL | 0 refills | Status: DC

## 2021-04-04 NOTE — Telephone Encounter (Signed)
Pt called reporting that she must have a 90 day supply sent to the [facility] per her insurance. Right now her Rx is 90 tablets for 1.5 mg daily. She needs enough to be able to take 1.5 daily for 90 days (135). She is completely out by tomorrow, please advise.  sertraline (ZOLOFT) 100 MG tablet  [id number] - [place], [place] - [address] AT [facility] OF [address] [place]  [address] [place] [address]  Phone: [phone] Fax: [phone]

## 2021-04-04 NOTE — Telephone Encounter (Signed)
Patient has made an additional call regarding the quantity adjustment Patient has stressed the urgency of resolution due to their medication running out 04/05/21

## 2021-06-07 ENCOUNTER — Other Ambulatory Visit: Payer: Self-pay | Admitting: Physician Assistant

## 2021-06-07 DIAGNOSIS — F419 Anxiety disorder, unspecified: Secondary | ICD-10-CM

## 2021-06-07 DIAGNOSIS — F339 Major depressive disorder, recurrent, unspecified: Secondary | ICD-10-CM

## 2021-06-07 NOTE — Telephone Encounter (Signed)
Requested medication (s) are due for refill today: yes  Requested medication (s) are on the active medication list:  yes   Last refill:  03/27/2021  Future visit scheduled: no  Notes to clinic:   This refill cannot be delegated  Requested Prescriptions  Pending Prescriptions Disp Refills   ALPRAZolam (XANAX) 0.25 MG tablet 30 tablet 1    Sig: Take 1 tablet (0.25 mg total) by mouth at bedtime as needed for anxiety.      Not Delegated - Psychiatry:  Anxiolytics/Hypnotics Failed - 06/07/2021 10:48 AM      Failed - This refill cannot be delegated      Failed - Urine Drug Screen completed in last 360 days      Passed - Valid encounter within last 6 months    Recent Outpatient Visits           2 months ago Anxiety   [facility] Just, Azalee Course, FNP   3 months ago Anxiety   [facility] Osvaldo Angst M, PA-C   4 months ago Major depression, recurrent, chronic [facility])   [facility] [place], [place], PA-C   4 months ago Major depression, recurrent, chronic [facility])   [facility] [place], [place], [place]   5 months ago Anxiety   [facility] [place], Ricki Rodriguez [place], [place]

## 2021-06-07 NOTE — Telephone Encounter (Signed)
Pt called to make sure here Rx refill request was received at the office. Pt reports she only has one left  [facility][id number] [place], [place] - [address] AT [facility] OF [address] [place]  [address] [place] [address]  Phone: [phone] Fax: [phone]

## 2021-06-07 NOTE — Telephone Encounter (Signed)
[facility] faxed refill request for the following medications:  ALPRAZolam (XANAX) 0.25 MG tablet  Please advise.  

## 2021-06-08 MED ORDER — ALPRAZOLAM 0.25 MG PO TABS: 0.2500 mg | ORAL_TABLET | Freq: Every evening | ORAL | 1 refills | Status: DC | PRN

## 2021-06-08 NOTE — Telephone Encounter (Signed)
Patient called in asking to get a refill on her medication today asking for a call back from nurse today please at Ph#  [phone]

## 2021-06-08 NOTE — Telephone Encounter (Signed)
LOV: 03/27/2021 NOV: None Last PHQ-9 & GAD-7 03/27/2021 Last refill: 03/27/2021 #30 w/1 refill Please advise?

## 2021-06-08 NOTE — Addendum Note (Signed)
Addended by: Marlene Lard on: 06/08/2021 10:09 AM   Modules accepted: Orders

## 2021-07-02 ENCOUNTER — Telehealth: Payer: Self-pay | Admitting: Physician Assistant

## 2021-07-02 DIAGNOSIS — F339 Major depressive disorder, recurrent, unspecified: Secondary | ICD-10-CM

## 2021-07-02 DIAGNOSIS — F419 Anxiety disorder, unspecified: Secondary | ICD-10-CM

## 2021-07-02 MED ORDER — SERTRALINE HCL 100 MG PO TABS: 150.0000 mg | ORAL_TABLET | Freq: Every day | ORAL | 0 refills | Status: DC

## 2021-07-02 NOTE — Telephone Encounter (Signed)
[facility] faxed refill request for the following medications:  sertraline (ZOLOFT) 100 MG tablet   Please advise.  

## 2021-07-12 ENCOUNTER — Other Ambulatory Visit: Payer: Self-pay | Admitting: Physician Assistant

## 2021-07-12 DIAGNOSIS — F339 Major depressive disorder, recurrent, unspecified: Secondary | ICD-10-CM

## 2021-07-12 DIAGNOSIS — F419 Anxiety disorder, unspecified: Secondary | ICD-10-CM

## 2021-07-12 NOTE — Telephone Encounter (Signed)
Medication Refill - Medication: sertraline (ZOLOFT) 100 MG tablet  Has the patient contacted their pharmacy? yes (Agent: If no, request that the patient contact the pharmacy for the refill.) (Agent: If yes, when and what did the pharmacy advise?)contact pcp/was told needs refill for 90 days  Preferred Pharmacy (with phone number or street name): [id number] [place], [place] - [address] AT [facility] OF [address] [facility]  [address] [place], [place] [address]  Phone:  [phone]  Fax:  [phone]    Agent: Please be advised that RX refills may take up to 3 business days. We ask that you follow-up with your pharmacy.

## 2021-07-19 ENCOUNTER — Ambulatory Visit: Payer: Managed Care, Other (non HMO) | Admitting: Family Medicine

## 2021-07-19 ENCOUNTER — Encounter: Payer: Self-pay | Admitting: Family Medicine

## 2021-07-19 ENCOUNTER — Other Ambulatory Visit: Payer: Self-pay

## 2021-07-19 VITALS — BP 118/90 | HR 92 | Temp 98.1°F | Resp 16 | Wt 109.7 lb

## 2021-07-19 DIAGNOSIS — F339 Major depressive disorder, recurrent, unspecified: Secondary | ICD-10-CM

## 2021-07-19 DIAGNOSIS — F419 Anxiety disorder, unspecified: Secondary | ICD-10-CM | POA: Diagnosis not present

## 2021-07-19 DIAGNOSIS — G4700 Insomnia, unspecified: Secondary | ICD-10-CM | POA: Insufficient documentation

## 2021-07-19 DIAGNOSIS — G4709 Other insomnia: Secondary | ICD-10-CM

## 2021-07-19 MED ORDER — SERTRALINE HCL 100 MG PO TABS: 200.0000 mg | ORAL_TABLET | Freq: Every day | ORAL | 3 refills | Status: DC

## 2021-07-19 MED ORDER — TRAZODONE HCL 100 MG PO TABS: 100.0000 mg | ORAL_TABLET | Freq: Every day | ORAL | 3 refills | Status: DC

## 2021-07-19 NOTE — Progress Notes (Signed)
Established patient visit   Patient: Annette Bowers   DOB: 1986-07-11   35 y.o. Female  MRN: [id number] Visit Date: 07/19/2021  Today's healthcare provider: Jacky Kindle, FNP   Chief Complaint  Patient presents with  . Anxiety  . Depression   Subjective    HPI  Anxiety, Follow-up  She was last seen for anxiety 4 months ago. Changes made at last visit include referral to psychiatry.   She reports excellent compliance with treatment. She reports excellent tolerance of treatment. She is not having side effects.  She feels her anxiety is moderate and Worse since last visit.  Symptoms: No chest pain Yes difficulty concentrating  No dizziness Yes fatigue  No feelings of losing control Yes insomnia  Yes irritable No palpitations  Yes panic attacks Yes racing thoughts  Yes shortness of breath Yes sweating  No tremors/shakes    GAD-7 Results GAD-7 Generalized Anxiety Disorder Screening Tool 03/27/2021 01/22/2021  1. Feeling Nervous, Anxious, or on Edge 3 3  2. Not Being Able to Stop or Control Worrying 3 3  3. Worrying Too Much About Different Things 3 3  4. Trouble Relaxing 3 3  5. Being So Restless it's Hard To Sit Still 0 0  6. Becoming Easily Annoyed or Irritable 3 3  7. Feeling Afraid As If Something Awful Might Happen 3 3  Total GAD-7 Score 18 18  Difficulty At Work, Home, or Getting  Along With Others? Somewhat difficult Very difficult    PHQ-9 Scores PHQ9 SCORE ONLY 07/19/2021 03/27/2021 01/22/2021  PHQ-9 Total Score 14 17 17     ---------------------------------------------------------------------------------------------------  Depression, Follow-up  She  was last seen for this 4 months ago. Changes made at last visit include referral psychiatry, increasing Zoloft from 100 to 150mg .   She reports excellent compliance with treatment.  Last dose taken 07/17/21; has been without medication. She is not having side effects.   She reports excellent tolerance  of treatment. Current symptoms include: depressed mood, difficulty concentrating, fatigue, feelings of worthlessness/guilt, hopelessness, insomnia, and recurrent thoughts of death She feels she is Worse since last visit.  Depression screen [facility] 2/9 07/19/2021 03/27/2021 01/22/2021  Decreased Interest 1 3 2   Down, Depressed, Hopeless 2 3 3   PHQ - 2 Score 3 6 5   Altered sleeping 3 3 2   Tired, decreased energy 3 2 2   Change in appetite 2 3 3   Feeling bad or failure about yourself  1 3 3   Trouble concentrating 1 0 2  Moving slowly or fidgety/restless 1 0 0  Suicidal thoughts 0 0 0  PHQ-9 Score 14 17 17   Difficult doing work/chores Somewhat difficult Extremely dIfficult Somewhat difficult    -----------------------------------------------------------------------------------------  Patient Active Problem List   Diagnosis Date Noted  . Insomnia 07/19/2021  . Anxiety 06/02/2015  . Major depression, recurrent, chronic (HCC) 06/02/2015   Past Medical History:Diagnosis Date  . Allergy   . Anemia   . Anxiety   . Insomnia   . Migraine   . Moderate major depression (HCC)    AllergiesAllergen Reactions  . Promethazine Hcl     drowsy  . Topiramate     drowsy       Medications: Outpatient Medications Prior to Visit  Medication Sig  . ALPRAZolam (XANAX) 0.25 MG tablet Take 1 tablet (0.25 mg total) by mouth at bedtime as needed for anxiety.  . sertraline (ZOLOFT) 100 MG tablet Take 1.5 tablets (150 mg total) by mouth daily. Please  schedule an office visit before anymore refills.   No facility-administered medications prior to visit.    Review of Systems     Objective    BP 118/90   Pulse 92   Temp 98.1 F (36.7 C) (Oral)   Resp 16   Wt 109 lb 11.2 oz (49.8 kg)   SpO2 100%   BMI 20.73 kg/m     Physical Exam Vitals and nursing note reviewed.  Constitutional:      Appearance: She is normal weight.  Cardiovascular:     Rate and Rhythm: Normal rate and regular rhythm.      Heart sounds: Normal heart sounds. No murmur heard.   No friction rub. No gallop.  Pulmonary:     Effort: Pulmonary effort is normal. No respiratory distress.     Breath sounds: Normal breath sounds. No wheezing, rhonchi or rales.  Chest:     Chest wall: No tenderness.  Skin:    General: Skin is warm and dry.  Neurological:     General: No focal deficit present.     Mental Status: She is alert and oriented to person, place, and time.  Psychiatric:        Attention and Perception: Attention and perception normal.        Mood and Affect: Mood normal. Affect is tearful.        Speech: Speech normal.        Behavior: Behavior normal. Behavior is cooperative.        Thought Content: Thought content normal.        Cognition and Memory: Cognition normal.        Judgment: Judgment normal.     Comments: Patient tearful in sharing the loss of her son and her mother; as well as the recent health change of her SO      No results found for any visits on 07/19/21.  Assessment & Plan     Problem List Items Addressed This Visit       Other   Insomnia (Chronic)    On going concern  Restart trazodone  Encourage sleep hygiene now that SO is out of the hospital      Anxiety    Situational  Significant other recently underwent kidney transplant at [facility]; living donor, SO's sister  SO doing well; remains out of work for 3 months p. Tx; had sutures removed this week        Major depression, recurrent, chronic (HCC) - Primary    Chronic in nature  Stable at this time  Pt going to counseling and has noticed that the day to day is much improved despite same GAD and PHQ scoring  Pt to reach out to psychiatry again for clarification on which medications they are asking her to be off of to be seen  Increase today to continue to improve mood; add trazodone for insomnia        Return in about 4 weeks (around 08/16/2021) for anxiety and depression.     Leilani Merl, FNP, have  reviewed all documentation for this visit. The documentation on 07/19/21 for the exam, diagnosis, procedures, and orders are all accurate and complete.  Jacky Kindle, FNP  [facility] [phone] (phone) [phone] (fax)  [facility]

## 2021-07-19 NOTE — Assessment & Plan Note (Signed)
Situational  Significant other recently underwent kidney transplant at [facility]; living donor, SO's sister  SO doing well; remains out of work for 3 months p. Tx; had sutures removed this week

## 2021-07-19 NOTE — Assessment & Plan Note (Signed)
On going concern  Restart trazodone  Encourage sleep hygiene now that SO is out of the hospital

## 2021-07-19 NOTE — Assessment & Plan Note (Signed)
Chronic in nature  Stable at this time  Pt going to counseling and has noticed that the day to day is much improved despite same GAD and PHQ scoring  Pt to reach out to psychiatry again for clarification on which medications they are asking her to be off of to be seen  Increase today to continue to improve mood; add trazodone for insomnia

## 2021-08-21 ENCOUNTER — Other Ambulatory Visit: Payer: Self-pay

## 2021-08-21 ENCOUNTER — Encounter: Payer: Self-pay | Admitting: Family Medicine

## 2021-08-21 ENCOUNTER — Ambulatory Visit: Payer: Managed Care, Other (non HMO) | Admitting: Family Medicine

## 2021-08-21 VITALS — BP 113/86 | HR 76 | Temp 98.1°F | Ht 61.0 in | Wt 113.4 lb

## 2021-08-21 DIAGNOSIS — G4709 Other insomnia: Secondary | ICD-10-CM

## 2021-08-21 DIAGNOSIS — F419 Anxiety disorder, unspecified: Secondary | ICD-10-CM

## 2021-08-21 DIAGNOSIS — F339 Major depressive disorder, recurrent, unspecified: Secondary | ICD-10-CM | POA: Diagnosis not present

## 2021-08-21 MED ORDER — ALPRAZOLAM 0.25 MG PO TABS: 0.2500 mg | ORAL_TABLET | Freq: Every day | ORAL | 1 refills | Status: DC | PRN

## 2021-08-21 NOTE — Progress Notes (Signed)
Established patient visit   Patient: Annette Bowers   DOB: [birth year]   35 y.o. Female  MRN: [id number] Visit Date: 08/21/2021  Today's healthcare provider: Jacky Kindle, FNP   Chief Complaint  Patient presents with   Follow-up   Anxiety   Depression   Subjective    HPI  Anxiety, Follow-up  She was last seen for anxiety 4 weeks ago. Changes made at last visit include addition of trazodone, increase of zoloft, continued behavior modification and as needed xanax.   She reports good compliance with treatment. She reports good tolerance of treatment. She is not having side effects.   She feels her anxiety is depends on the day as far as the severity and Improved since last visit.  Symptoms: No chest pain Yes difficulty concentrating  No dizziness Yes fatigue  No feelings of losing control No insomnia  Yes irritable Yes palpitations  Yes panic attacks No racing thoughts  Yes shortness of breath Yes sweating  Yes tremors/shakes    The symptoms the patient replied "yes" to are when she is having an attack; not continuous or daily in nature.  GAD-7 Results GAD-7 Generalized Anxiety Disorder Screening Tool 03/27/2021 01/22/2021  1. Feeling Nervous, Anxious, or on Edge 3 3  2. Not Being Able to Stop or Control Worrying 3 3  3. Worrying Too Much About Different Things 3 3  4. Trouble Relaxing 3 3  5. Being So Restless it's Hard To Sit Still 0 0  6. Becoming Easily Annoyed or Irritable 3 3  7. Feeling Afraid As If Something Awful Might Happen 3 3  Total GAD-7 Score 18 18  Difficulty At Work, Home, or Getting  Along With Others? Somewhat difficult Very difficult    PHQ-9 Scores PHQ9 SCORE ONLY 08/21/2021 07/19/2021 03/27/2021  PHQ-9 Total Score 7 14 17     ---------------------------------------------------------------------------------------------------  Medications: Outpatient Medications Prior to Visit  Medication Sig   sertraline (ZOLOFT) 100 MG tablet Take 2  tablets (200 mg total) by mouth daily.   traZODone (DESYREL) 100 MG tablet Take 1 tablet (100 mg total) by mouth at bedtime. May take 1/2 (50mg ) to 1 (100mg ) at night as needed.   [DISCONTINUED] ALPRAZolam (XANAX) 0.25 MG tablet Take 1 tablet (0.25 mg total) by mouth at bedtime as needed for anxiety.   [DISCONTINUED] sertraline (ZOLOFT) 100 MG tablet Take 1.5 tablets (150 mg total) by mouth daily. Please schedule an office visit before anymore refills.   No facility-administered medications prior to visit.    Review of Systems  Last CBC Lab Results  Component Value Date   WBC 9.4 01/21/2017   HGB 13.9 01/21/2017   HCT 39.6 01/21/2017   MCV 90 01/21/2017   MCH 31.6 01/21/2017   RDW 12.3 01/21/2017   PLT 223 01/21/2017   Last metabolic panel Lab Results  Component Value Date   GLUCOSE 97 01/21/2017   NA 139 01/21/2017   K 4.4 01/21/2017   CL 100 01/21/2017   CO2 24 01/21/2017   BUN 9 01/21/2017   CREATININE 0.63 01/21/2017   GFRNONAA 122 01/21/2017   GFRAA 141 01/21/2017   CALCIUM 8.9 01/21/2017   PROT 7.2 01/21/2017   ALBUMIN 4.6 01/21/2017   LABGLOB 2.6 01/21/2017   AGRATIO 1.8 01/21/2017   BILITOT 0.3 01/21/2017   ALKPHOS 46 01/21/2017   AST 14 01/21/2017   ALT 12 01/21/2017   ANIONGAP 6 (L) 09/01/2013   Last lipids Lab Results  Component Value  Date   CHOL 127 01/21/2017   HDL 35 (L) 01/21/2017   LDLCALC 78 01/21/2017   TRIG 69 01/21/2017   Last hemoglobin A1c Lab Results  Component Value Date   HGBA1C 5.3 01/21/2017   Last thyroid functions Lab Results  Component Value Date   TSH 0.762 01/21/2017   Last vitamin D Lab Results  Component Value Date   VD25OH 40.3 07/08/2017       Objective    BP 113/86 (BP Location: Right Arm, Patient Position: Sitting, Cuff Size: Normal)   Pulse 76   Temp 98.1 F (36.7 C) (Oral)   Ht 5\' 1"  (1.549 m)   Wt 113 lb 6.4 oz (51.4 kg)   SpO2 98%   BMI 21.43 kg/m  BP Readings from Last 3 Encounters:  08/21/21  113/86  07/19/21 118/90  02/24/19 122/86   Wt Readings from Last 3 Encounters:  08/21/21 113 lb 6.4 oz (51.4 kg)  07/19/21 109 lb 11.2 oz (49.8 kg)  03/29/19 105 lb (47.6 kg)      Physical Exam Vitals and nursing note reviewed.  Constitutional:      General: She is not in acute distress.    Appearance: Normal appearance. She is normal weight. She is not ill-appearing, toxic-appearing or diaphoretic.  HENT:     Head: Normocephalic and atraumatic.  Cardiovascular:     Rate and Rhythm: Normal rate and regular rhythm.     Pulses: Normal pulses.     Heart sounds: Normal heart sounds. No murmur heard.   No friction rub. No gallop.  Pulmonary:     Effort: Pulmonary effort is normal. No respiratory distress.     Breath sounds: Normal breath sounds. No stridor. No wheezing, rhonchi or rales.  Chest:     Chest wall: No tenderness.  Abdominal:     General: Bowel sounds are normal.     Palpations: Abdomen is soft.  Musculoskeletal:        General: No swelling, tenderness, deformity or signs of injury. Normal range of motion.     Right lower leg: No edema.     Left lower leg: No edema.  Skin:    General: Skin is warm and dry.     Capillary Refill: Capillary refill takes less than 2 seconds.     Coloration: Skin is not jaundiced or pale.     Findings: No bruising, erythema, lesion or rash.  Neurological:     General: No focal deficit present.     Mental Status: She is alert and oriented to person, place, and time. Mental status is at baseline.     Cranial Nerves: No cranial nerve deficit.     Sensory: No sensory deficit.     Motor: No weakness.     Coordination: Coordination normal.  Psychiatric:        Mood and Affect: Mood normal.        Behavior: Behavior normal.        Thought Content: Thought content normal.        Judgment: Judgment normal.     No results found for any visits on 08/21/21.  Assessment & Plan     Problem List Items Addressed This Visit       Other    Insomnia - Primary (Chronic)    Continue to reinforce sleep hygiene  Has noticed an improvement since starting back on trazodone  Takes 1/2-1 tablet per night, some early mornings d/t job  Continue to monitor  Anxiety    Improved Continues to have multiple panic attacks throughout week; no longer daily or more frequent Database review of Xanax- refill provided Remains on daily medication, happy with dose change Remains situational- health of SO and now buying home      Relevant Medications   ALPRAZolam (XANAX) 0.25 MG tablet   Major depression, recurrent, chronic (HCC)    Stable, chronic No complaints today Continue to monitor s/s      Relevant Medications   ALPRAZolam (XANAX) 0.25 MG tablet     Return in about 3 months (around 11/20/2021) for annual examination, anxiety and depression.     Leilani Merl, FNP, have reviewed all documentation for this visit. The documentation on 08/21/21 for the exam, diagnosis, procedures, and orders are all accurate and complete.    Jacky Kindle, FNP  [facility] [phone] (phone) [phone] (fax)  [facility]

## 2021-08-21 NOTE — Assessment & Plan Note (Signed)
Improved Continues to have multiple panic attacks throughout week; no longer daily or more frequent Database review of Xanax- refill provided Remains on daily medication, happy with dose change Remains situational- health of SO and now buying home

## 2021-08-21 NOTE — Assessment & Plan Note (Signed)
Stable, chronic No complaints today Continue to monitor s/s

## 2021-08-21 NOTE — Assessment & Plan Note (Signed)
Continue to reinforce sleep hygiene  Has noticed an improvement since starting back on trazodone  Takes 1/2-1 tablet per night, some early mornings d/t job  Continue to monitor

## 2021-10-25 ENCOUNTER — Other Ambulatory Visit: Payer: Self-pay | Admitting: Family Medicine

## 2021-10-25 DIAGNOSIS — F419 Anxiety disorder, unspecified: Secondary | ICD-10-CM

## 2021-10-25 NOTE — Telephone Encounter (Signed)
Requested medication (s) are due for refill today: yes  Requested medication (s) are on the active medication list:yes   Last refill: 08/21/21  #30  1 refill  Future visit scheduled no  Notes to clinic: Not delegated  Requested Prescriptions  Pending Prescriptions Disp Refills   ALPRAZolam (XANAX) 0.25 MG tablet [Pharmacy Med Name: ALPRAZOLAM 0.25MG  TABLETS] 30 tablet     Sig: TAKE 1 TABLET(0.25 MG) BY MOUTH DAILY AS NEEDED FOR ANXIETY. DO NOT FILL WITHIN 30 DAYS OF LAST FILL DATE     Not Delegated - Psychiatry:  Anxiolytics/Hypnotics Failed - 10/25/2021  8:07 AM      Failed - This refill cannot be delegated      Failed - Urine Drug Screen completed in last 360 days      Passed - Valid encounter within last 6 months    Recent Outpatient Visits           2 months ago Other insomnia   [facility] Merita Norton T, FNP   3 months ago Major depression, recurrent, chronic [facility])   [facility] Jacky Kindle, FNP   7 months ago Anxiety   [facility] Just, Azalee Course, FNP   8 months ago Anxiety   [facility] Osvaldo Angst M, PA-C   8 months ago Major depression, recurrent, chronic [facility])   [facility] [place], [place], [place]

## 2021-12-03 ENCOUNTER — Other Ambulatory Visit: Payer: Self-pay | Admitting: Family Medicine

## 2021-12-03 DIAGNOSIS — F419 Anxiety disorder, unspecified: Secondary | ICD-10-CM

## 2022-04-15 ENCOUNTER — Other Ambulatory Visit: Payer: Self-pay | Admitting: Family Medicine

## 2022-04-15 DIAGNOSIS — F419 Anxiety disorder, unspecified: Secondary | ICD-10-CM

## 2022-04-16 NOTE — Telephone Encounter (Signed)
Requested medication (s) are due for refill today: yes ? ?Requested medication (s) are on the active medication list: yes ? ?Last refill:  12/04/21 #30 3 RF ? ?Future visit scheduled: no ? ?Notes to clinic:  med not delegated to NT to RF ? ? ?Requested Prescriptions  ?Pending Prescriptions Disp Refills  ? ALPRAZolam (XANAX) 0.25 MG tablet [Pharmacy Med Name: ALPRAZOLAM 0.25MG  TABLETS] 30 tablet   ?  Sig: TAKE 1 TABLET(0.25 MG) BY MOUTH DAILY AS NEEDED FOR ANXIETY.  ?  ? Not Delegated - Psychiatry: Anxiolytics/Hypnotics 2 Failed - 04/15/2022  8:00 AM  ?  ?  Failed - This refill cannot be delegated  ?  ?  Failed - Urine Drug Screen completed in last 360 days  ?  ?  Failed - Valid encounter within last 6 months  ?  Recent Outpatient Visits   ? ?      ? 7 months ago Other insomnia  ? [facility] Jacky Kindle, FNP  ? 9 months ago Major depression, recurrent, chronic (HCC)  ? [facility] Merita Norton T, FNP  ? 1 year ago Anxiety  ? [facility] Just, Azalee Course, FNP  ? 1 year ago Anxiety  ? [facility] [place], [place] M, [place]  ? 1 year ago Major depression, recurrent, chronic (HCC)  ? [facility] [place], [place], [place]  ? ?  ?  ? ? ?  ?  ?  Passed - Patient is not pregnant  ?  ?  ? ? ? ? ?

## 2022-09-13 ENCOUNTER — Other Ambulatory Visit: Payer: Self-pay | Admitting: Family Medicine

## 2022-09-13 NOTE — Telephone Encounter (Signed)
Requested medication (s) are due for refill today: yes  Requested medication (s) are on the active medication list: yes  Last refill:  07/19/2021 #180 with 3 RF  Future visit scheduled: no, seen 08/21/2021  Notes to clinic:  Failed protocol due to labs are from 2018 and last visit was more than 12 months ago, please assess.   Requested Prescriptions  Pending Prescriptions Disp Refills   sertraline (ZOLOFT) 100 MG tablet [Pharmacy Med Name: SERTRALINE 100MG  TABLETS] 180 tablet 3    Sig: TAKE 2 TABLETS(200 MG) BY MOUTH DAILY     Psychiatry:  Antidepressants - SSRI - sertraline Failed - 09/13/2022  5:27 PM      Failed - AST in normal range and within 360 days    AST  Date Value Ref Range Status  01/21/2017 14 0 - 40 IU/L Final   SGOT(AST)  Date Value Ref Range Status  09/01/2013 19 15 - 37 Unit/L Final         Failed - ALT in normal range and within 360 days    ALT  Date Value Ref Range Status  01/21/2017 12 0 - 32 IU/L Final   SGPT (ALT)  Date Value Ref Range Status  09/01/2013 21 12 - 78 U/L Final         Failed - Completed PHQ-2 or PHQ-9 in the last 360 days      Failed - Valid encounter within last 6 months    Recent Outpatient Visits           1 year ago Other insomnia   [facility] Annette Sprout, FNP   1 year ago Major depression, recurrent, chronic [facility])   [facility] Annette Sprout, FNP   1 year ago [place] Just, Laurita Quint, FNP   1 year ago [place], Wendee Beavers, PA-C   1 year ago Major depression, recurrent, chronic [facility])   [place], [place], [place]

## 2022-09-16 ENCOUNTER — Ambulatory Visit: Payer: Self-pay | Admitting: *Deleted

## 2022-09-16 NOTE — Telephone Encounter (Signed)
Pt requested Zoloft even though she has appt Thursday. The pharm gave her a few emergency supply Zoloft but not enough. Pt already requested Zoloft rx but her labs were out of date so it is still waiting to be filled or denied. Her appt is Thursday. I can not fill it because of the lab date does not meet protocol. Could pt get at least a small refill until her appt. She states 200mg  a day works great but she had to cut down to make it to appt.  Note:Appt Jennette Bill, needs Zoloft 100 mg takes 2 a day. Failed labs so already sent through for review.

## 2022-09-18 ENCOUNTER — Other Ambulatory Visit: Payer: Self-pay | Admitting: Family Medicine

## 2022-09-18 MED ORDER — SERTRALINE HCL 100 MG PO TABS: 200.0000 mg | ORAL_TABLET | Freq: Every day | ORAL | 0 refills | Status: DC

## 2022-09-19 ENCOUNTER — Encounter: Payer: Self-pay | Admitting: Family Medicine

## 2022-09-19 ENCOUNTER — Ambulatory Visit: Payer: Managed Care, Other (non HMO) | Admitting: Family Medicine

## 2022-09-19 VITALS — BP 119/85 | HR 77 | Resp 16 | Ht 61.0 in | Wt 115.0 lb

## 2022-09-19 DIAGNOSIS — F32A Depression, unspecified: Secondary | ICD-10-CM

## 2022-09-19 DIAGNOSIS — R5383 Other fatigue: Secondary | ICD-10-CM

## 2022-09-19 DIAGNOSIS — Z131 Encounter for screening for diabetes mellitus: Secondary | ICD-10-CM

## 2022-09-19 DIAGNOSIS — F339 Major depressive disorder, recurrent, unspecified: Secondary | ICD-10-CM

## 2022-09-19 DIAGNOSIS — F419 Anxiety disorder, unspecified: Secondary | ICD-10-CM | POA: Diagnosis not present

## 2022-09-19 DIAGNOSIS — Z114 Encounter for screening for human immunodeficiency virus [HIV]: Secondary | ICD-10-CM

## 2022-09-19 DIAGNOSIS — Z1159 Encounter for screening for other viral diseases: Secondary | ICD-10-CM | POA: Insufficient documentation

## 2022-09-19 DIAGNOSIS — Z13220 Encounter for screening for lipoid disorders: Secondary | ICD-10-CM | POA: Insufficient documentation

## 2022-09-19 DIAGNOSIS — Z1329 Encounter for screening for other suspected endocrine disorder: Secondary | ICD-10-CM

## 2022-09-19 DIAGNOSIS — Z136 Encounter for screening for cardiovascular disorders: Secondary | ICD-10-CM

## 2022-09-19 HISTORY — DX: Encounter for screening for human immunodeficiency virus (HIV): Z11.4

## 2022-09-19 MED ORDER — SERTRALINE HCL 100 MG PO TABS: 200.0000 mg | ORAL_TABLET | Freq: Every day | ORAL | 3 refills | Status: DC

## 2022-09-19 MED ORDER — ALPRAZOLAM 0.25 MG PO TABS: ORAL_TABLET | ORAL | 5 refills | Status: DC

## 2022-09-19 NOTE — Progress Notes (Signed)
Established patient visit   Patient: Annette Bowers   DOB: 1986-11-11   36 y.o. Female  MRN: [id number] Visit Date: 09/19/2022  Today's healthcare provider: Jacky Kindle, FNP  Introduced to nurse practitioner role and practice setting.  All questions answered.  Discussed provider/patient relationship and expectations.  I,Tiffany J Bragg,acting as a scribe for Jacky Kindle, FNP.,have documented all relevant documentation on the behalf of Jacky Kindle, FNP,as directed by  Jacky Kindle, FNP while in the presence of Jacky Kindle, FNP.   Chief Complaint  Patient presents with   Depression   Anxiety   Insomnia   Subjective    HPI  Anxiety/Depression, Follow-up  She was last seen for anxiety 1 years ago. Changes made at last visit include continue medications.   She reports excellent compliance with treatment. She reports excellent tolerance of treatment. She is not having side effects.   She feels her anxiety is mild and Worse since last visit.  Symptoms: No chest pain Yes difficulty concentrating  No dizziness No fatigue  Yes feelings of losing control Yes insomnia  Yes irritable Yes palpitations  Yes panic attacks Yes racing thoughts  No shortness of breath No sweating  No tremors/shakes    GAD-7 Results    03/27/2021    1:21 PM 01/22/2021    1:59 PM  GAD-7 Generalized Anxiety Disorder Screening Tool  1. Feeling Nervous, Anxious, or on Edge 3 3  2. Not Being Able to Stop or Control Worrying 3 3  3. Worrying Too Much About Different Things 3 3  4. Trouble Relaxing 3 3  5. Being So Restless it's Hard To Sit Still 0 0  6. Becoming Easily Annoyed or Irritable 3 3  7. Feeling Afraid As If Something Awful Might Happen 3 3  Total GAD-7 Score 18 18  Difficulty At Work, Home, or Getting  Along With Others? Somewhat difficult Very difficult    PHQ-9 Scores    09/19/2022    1:13 PM 08/21/2021    2:53 PM 07/19/2021    1:32 PM  PHQ9 SCORE ONLY  PHQ-9 Total  Score 16 7 14     ---------------------------------------------------------------------------------------------------   Medications: Outpatient Medications Prior to Visit  Medication Sig   sertraline (ZOLOFT) 100 MG tablet Take 2 tablets (200 mg total) by mouth daily.   traZODone (DESYREL) 100 MG tablet Take 1 tablet (100 mg total) by mouth at bedtime. May take 1/2 (50mg ) to 1 (100mg ) at night as needed.   [DISCONTINUED] ALPRAZolam (XANAX) 0.25 MG tablet TAKE 1 TABLET(0.25 MG) BY MOUTH DAILY AS NEEDED FOR ANXIETY   No facility-administered medications prior to visit.    Review of Systems    Objective    BP 119/85 (BP Location: Right Arm, Patient Position: Sitting, Cuff Size: Normal)   Pulse 77   Resp 16   Ht 5\' 1"  (1.549 m)   Wt 115 lb (52.2 kg)   SpO2 98%   BMI 21.73 kg/m   Physical Exam Vitals and nursing note reviewed.  Constitutional:      General: She is not in acute distress.    Appearance: Normal appearance. She is normal weight. She is not ill-appearing, toxic-appearing or diaphoretic.  HENT:     Head: Normocephalic and atraumatic.  Cardiovascular:     Rate and Rhythm: Normal rate and regular rhythm.     Pulses: Normal pulses.     Heart sounds: Normal heart sounds. No murmur heard.  No friction rub. No gallop.  Pulmonary:     Effort: Pulmonary effort is normal. No respiratory distress.     Breath sounds: Normal breath sounds. No stridor. No wheezing, rhonchi or rales.  Chest:     Chest wall: No tenderness.  Musculoskeletal:        General: No swelling, tenderness, deformity or signs of injury. Normal range of motion.     Right lower leg: No edema.     Left lower leg: No edema.  Skin:    General: Skin is warm and dry.     Capillary Refill: Capillary refill takes less than 2 seconds.     Coloration: Skin is not jaundiced or pale.     Findings: No bruising, erythema, lesion or rash.  Neurological:     General: No focal deficit present.     Mental Status:  She is alert and oriented to person, place, and time. Mental status is at baseline.     Cranial Nerves: No cranial nerve deficit.     Sensory: No sensory deficit.     Motor: No weakness.     Coordination: Coordination normal.  Psychiatric:        Mood and Affect: Mood normal.        Behavior: Behavior normal.        Thought Content: Thought content normal.        Judgment: Judgment normal.     No results found for any visits on 09/19/22.  Assessment & Plan     Problem List Items Addressed This Visit       Other   Anxiety    Acute on chronic, Pt is aware of risks of anxiety medication use to include increased sedation, respiratory suppression, falls, dependence and cardiovascular events.  PT would like to continue treatment as benefit determined to outweigh risk.   OK for 6 month refills of low dose benzos      Relevant Medications   sertraline (ZOLOFT) 100 MG tablet   ALPRAZolam (XANAX) 0.25 MG tablet   Encounter for hepatitis C screening test for low risk patient    Low risk screen Treatable, and curable. If left untreated Hep C can lead to cirrhosis and liver failure. Encourage routine testing; recommend repeat testing if risk factors change.       Relevant Orders   Hepatitis C Antibody   Encounter for lipid screening for cardiovascular disease    Recommend screening for CV disease with lipid labs I recommend diet low in saturated fat and regular exercise - 30 min at least 5 times per week       Relevant Orders   Lipid panel   Encounter for screening for HIV    Low risk screen; encouraged to "know your status" Recommend repeat screen if risk factors change       Relevant Orders   HIV antibody (with reflex)   Fatigue due to depression    Chronic, stable Recommend additional labs for vitamin screening      Relevant Orders   Vitamin D (25 hydroxy)   B12 and Folate Panel   Major depression, recurrent, chronic (HCC) - Primary    Chronic, stable Still  experiencing highs/lows Denies SI or HI Continue Zoloft 200 mg to assist       Relevant Medications   sertraline (ZOLOFT) 100 MG tablet   ALPRAZolam (XANAX) 0.25 MG tablet   Other Relevant Orders   CBC with Differential/Platelet   Comprehensive Metabolic Panel (CMET)   Screening  for diabetes mellitus    Recommend initial screening for diabetes given fatigue       Relevant Orders   Hemoglobin A1c   Screening for thyroid disorder    Recommend screening for thyroid disease with coexisting anxiety      Relevant Orders   TSH + free T4     Return in about 6 months (around 03/21/2023) for annual examination.      Vonna Kotyk, FNP, have reviewed all documentation for this visit. The documentation on 09/19/22 for the exam, diagnosis, procedures, and orders are all accurate and complete.    Gwyneth Sprout, [place] [phone] (phone) [phone] (fax)  [place]

## 2022-09-19 NOTE — Assessment & Plan Note (Signed)
Low risk screen Treatable, and curable. If left untreated Hep C can lead to cirrhosis and liver failure. Encourage routine testing; recommend repeat testing if risk factors change.  

## 2022-09-19 NOTE — Assessment & Plan Note (Addendum)
Low risk screen; encouraged to "know your status" Recommend repeat screen if risk factors change  

## 2022-09-19 NOTE — Assessment & Plan Note (Signed)
Recommend initial screening for diabetes given fatigue

## 2022-09-19 NOTE — Assessment & Plan Note (Signed)
Recommend screening for thyroid disease with coexisting anxiety

## 2022-09-19 NOTE — Assessment & Plan Note (Signed)
Chronic, stable Still experiencing highs/lows Denies SI or HI Continue Zoloft 200 mg to assist

## 2022-09-19 NOTE — Assessment & Plan Note (Signed)
Acute on chronic, Pt is aware of risks of anxiety medication use to include increased sedation, respiratory suppression, falls, dependence and cardiovascular events.  PT would like to continue treatment as benefit determined to outweigh risk.   OK for 6 month refills of low dose benzos

## 2022-09-19 NOTE — Assessment & Plan Note (Signed)
Recommend screening for CV disease with lipid labs I recommend diet low in saturated fat and regular exercise - 30 min at least 5 times per week

## 2022-09-19 NOTE — Assessment & Plan Note (Signed)
Chronic, stable Recommend additional labs for vitamin screening

## 2022-09-20 ENCOUNTER — Other Ambulatory Visit: Payer: Self-pay | Admitting: Family Medicine

## 2022-09-20 DIAGNOSIS — E559 Vitamin D deficiency, unspecified: Secondary | ICD-10-CM

## 2022-09-20 LAB — COMPREHENSIVE METABOLIC PANEL
ALT: 11 IU/L (ref 0–32)
AST: 14 IU/L (ref 0–40)
Albumin/Globulin Ratio: 2.6 — ABNORMAL HIGH (ref 1.2–2.2)
Albumin: 5 g/dL — ABNORMAL HIGH (ref 3.9–4.9)
Alkaline Phosphatase: 60 IU/L (ref 44–121)
BUN/Creatinine Ratio: 13 (ref 9–23)
BUN: 9 mg/dL (ref 6–20)
Bilirubin Total: 0.3 mg/dL (ref 0.0–1.2)
CO2: 26 mmol/L (ref 20–29)
Calcium: 9.6 mg/dL (ref 8.7–10.2)
Chloride: 101 mmol/L (ref 96–106)
Creatinine, Ser: 0.69 mg/dL (ref 0.57–1.00)
Globulin, Total: 1.9 g/dL (ref 1.5–4.5)
Glucose: 99 mg/dL (ref 70–99)
Potassium: 4.4 mmol/L (ref 3.5–5.2)
Sodium: 142 mmol/L (ref 134–144)
Total Protein: 6.9 g/dL (ref 6.0–8.5)
eGFR: 117 mL/min/{1.73_m2} (ref >59)

## 2022-09-20 LAB — CBC WITH DIFFERENTIAL/PLATELET
Basophils Absolute: 0.1 10*3/uL (ref 0.0–0.2)
Basos: 1 %
EOS (ABSOLUTE): 0.7 10*3/uL — ABNORMAL HIGH (ref 0.0–0.4)
Eos: 9 %
Hematocrit: 38.4 % (ref 34.0–46.6)
Hemoglobin: 12.8 g/dL (ref 11.1–15.9)
Immature Grans (Abs): 0 10*3/uL (ref 0.0–0.1)
Immature Granulocytes: 0 %
Lymphocytes Absolute: 2.5 10*3/uL (ref 0.7–3.1)
Lymphs: 30 %
MCH: 31.2 pg (ref 26.6–33.0)
MCHC: 33.3 g/dL (ref 31.5–35.7)
MCV: 94 fL (ref 79–97)
Monocytes Absolute: 0.6 10*3/uL (ref 0.1–0.9)
Monocytes: 8 %
Neutrophils Absolute: 4.5 10*3/uL (ref 1.4–7.0)
Neutrophils: 52 %
Platelets: 244 10*3/uL (ref 150–450)
RBC: 4.1 x10E6/uL (ref 3.77–5.28)
RDW: 11.6 % — ABNORMAL LOW (ref 11.7–15.4)
WBC: 8.5 10*3/uL (ref 3.4–10.8)

## 2022-09-20 LAB — HEMOGLOBIN A1C
Est. average glucose Bld gHb Est-mCnc: 100 mg/dL
Hgb A1c MFr Bld: 5.1 % (ref 4.8–5.6)

## 2022-09-20 LAB — TSH+FREE T4
Free T4: 1.16 ng/dL (ref 0.82–1.77)
TSH: 0.894 u[IU]/mL (ref 0.450–4.500)

## 2022-09-20 LAB — LIPID PANEL
Chol/HDL Ratio: 3.9 ratio (ref 0.0–4.4)
Cholesterol, Total: 208 mg/dL — ABNORMAL HIGH (ref 100–199)
HDL: 54 mg/dL (ref >39)
LDL Chol Calc (NIH): 115 mg/dL — ABNORMAL HIGH (ref 0–99)
Triglycerides: 224 mg/dL — ABNORMAL HIGH (ref 0–149)
VLDL Cholesterol Cal: 39 mg/dL (ref 5–40)

## 2022-09-20 LAB — B12 AND FOLATE PANEL
Folate: 3.8 ng/mL (ref >3.0)
Vitamin B-12: 570 pg/mL (ref 232–1245)

## 2022-09-20 LAB — HIV ANTIBODY (ROUTINE TESTING W REFLEX): HIV Screen 4th Generation wRfx: NONREACTIVE

## 2022-09-20 LAB — VITAMIN D 25 HYDROXY (VIT D DEFICIENCY, FRACTURES): Vit D, 25-Hydroxy: 6.8 ng/mL — ABNORMAL LOW (ref 30.0–100.0)

## 2022-09-20 LAB — HEPATITIS C ANTIBODY: Hep C Virus Ab: NONREACTIVE

## 2022-09-20 MED ORDER — VITAMIN D (ERGOCALCIFEROL) 1.25 MG (50000 UNIT) PO CAPS: 50000.0000 [IU] | ORAL_CAPSULE | ORAL | 1 refills | Status: DC

## 2022-09-20 NOTE — Progress Notes (Signed)
Cholesterol remains slightly elevated; LDL at 115, goal <100. I recommend diet low in saturated fat and regular exercise - 30 min at least 5 times per week  Recommend high dose Vit D supplement weekly.  All other labs are normal and stable.  Gwyneth Sprout, [place] [address] [place], [place] [address] [phone] (phone) [phone] (fax) [place]

## 2022-10-18 ENCOUNTER — Other Ambulatory Visit: Payer: Self-pay | Admitting: Family Medicine

## 2022-10-18 DIAGNOSIS — F419 Anxiety disorder, unspecified: Secondary | ICD-10-CM

## 2022-10-18 NOTE — Telephone Encounter (Signed)
Unable to refill per protocol, Rx request is too soon, last refill 09/19/22 for 30 and 5 refills. Will refuse.  Requested Prescriptions  Pending Prescriptions Disp Refills   ALPRAZolam (XANAX) 0.25 MG tablet [Pharmacy Med Name: ALPRAZOLAM 0.25MG  TABLETS] 30 tablet     Sig: TAKE 1 TABLET(0.25 MG) BY MOUTH DAILY AS NEEDED FOR ANXIETY     Not Delegated - Psychiatry: Anxiolytics/Hypnotics 2 Failed - 10/18/2022  2:01 PM      Failed - This refill cannot be delegated      Failed - Urine Drug Screen completed in last 360 days      Passed - Patient is not pregnant      Passed - Valid encounter within last 6 months    Recent Outpatient Visits           4 weeks ago Major depression, recurrent, chronic [facility])   [facility] Jacky Kindle, FNP   1 year ago Other insomnia   [facility] Mer[facility] Norton T, FNP   1 year ago Major depression, recurrent, chronic [facility])   [facility] Jacky Kindle, FNP   1 year ago Anxiety    [facility] Just, Azalee Course, FNP   1 year ago Anxiety   [facility] [place], [place], [place]       Future Appointments             In 5 months Jacky Kindle, FNP [facility], [facility]

## 2023-03-17 NOTE — Progress Notes (Signed)
I,J'ya E Hunter,acting as a scribe for Jacky Kindle, FNP.,have documented all relevant documentation on the behalf of Jacky Kindle, FNP,as directed by  Jacky Kindle, FNP while in the presence of Jacky Kindle, FNP.   Complete physical exam   Patient: Annette Bowers   DOB: 11-16-1986   36 y.o. Female  MRN: [id number] Visit Date: 03/18/2023  Today's healthcare provider: Jacky Kindle, FNP   No chief complaint on file.  Subjective    Annette Bowers is a 37 y.o. female who presents today for a complete physical exam.  She reports consuming a general diet. Home exercise routine includes walking .5 hrs per day. She generally feels fairly well. She reports sleeping poorly. She does have additional problems to discuss today.   Patient is aware that she needs as PAP, will contact OB/GYN HPI   Past Medical History:  Diagnosis Date   Allergy    Anemia    Anxiety    Encounter for screening for HIV 09/19/2022   Insomnia    Migraine    Moderate major depression    Past Surgical History:  Procedure Laterality Date   WISDOM TOOTH EXTRACTION  2011   Social History   Socioeconomic History   Marital status: Significant Other    Spouse name: Not on file   Number of children: 1   Years of education: Not on file   Highest education level: Not on file  Occupational History    Employer: [employer]  Tobacco Use   Smoking status: Former    Packs/day: 0.50    Years: 8.00    Additional pack years: 0.00    Total pack years: 4.00    Types: Cigarettes   Smokeless tobacco: Never   Tobacco comments:    Vaping daily  Vaping Use   Vaping Use: Every day  Substance and Sexual Activity   Alcohol use: Yes    Alcohol/week: 3.0 - 4.0 standard drinks of alcohol    Types: 3 - 4 Glasses of wine per week   Drug use: No   Sexual activity: Yes    Birth control/protection: None  Other Topics Concern   Not on file  Social History Narrative   Not on file   Social Determinants of Health    Financial Resource Strain: Low Risk  (04/21/2019)   Overall Financial Resource Strain (CARDIA)    Difficulty of Paying Living Expenses: Not very hard  Food Insecurity: No Food Insecurity (03/19/2023)   Hunger Vital Sign    Worried About Running Out of Food in the Last Year: Never true    Ran Out of Food in the Last Year: Never true  Transportation Needs: No Transportation Needs (02/24/2019)   PRAPARE - Administrator, Civil Service (Medical): No    Lack of Transportation (Non-Medical): No  Physical Activity: Not on file  Stress: Stress Concern Present (04/21/2019)   [employer] of Occupational Health - Occupational Stress Questionnaire    Feeling of Stress : Very much  Social Connections: Moderately Isolated (04/21/2019)   Social Connection and Isolation Panel [NHANES]    Frequency of Communication with Friends and Family: Never    Frequency of Social Gatherings with Friends and Family: Never    Attends Religious Services: Never    Database administrator or Organizations: No    Attends Banker Meetings: Never    Marital Status: Living with partner  Intimate Partner Violence: Not At Risk (04/21/2019)  Humiliation, Afraid, Rape, and Kick questionnaire    Fear of Current or Ex-Partner: No    Emotionally Abused: No    Physically Abused: No    Sexually Abused: No   Family Status  Relation Name Status   Mother  Alive   Father  Alive   MGM  (Not Specified)   MGF  (Not Specified)   PGM  (Not Specified)   Cousin  (Not Specified)   Mat Uncle  (Not Specified)   Family History  Problem Relation Age of Onset   Hyperlipidemia Mother    Hypertension Mother    Diabetes Father    Hypertension Father    Diabetes Maternal Grandmother    Heart disease Maternal Grandmother    Hypertension Maternal Grandmother    Arthritis Maternal Grandmother    Kidney failure Maternal Grandmother    Diabetes Maternal Grandfather    Lung cancer Paternal Grandmother     Cancer Paternal Grandmother    Multiple sclerosis Cousin    Hypertension Maternal Uncle    Allergies  Allergen Reactions   Promethazine Hcl     drowsy   Topiramate     drowsy    Patient Care Team: Jacky Kindle, FNP as PCP - General (Family Medicine)   Medications: Outpatient Medications Prior to Visit  Medication Sig   ALPRAZolam (XANAX) 0.25 MG tablet TAKE 1 TABLET(0.25 MG) BY MOUTH DAILY AS NEEDED FOR ANXIETY   sertraline (ZOLOFT) 100 MG tablet Take 2 tablets (200 mg total) by mouth daily.   sertraline (ZOLOFT) 100 MG tablet Take 2 tablets (200 mg total) by mouth daily.   traZODone (DESYREL) 100 MG tablet Take 1 tablet (100 mg total) by mouth at bedtime. May take 1/2 (50mg ) to 1 (100mg ) at night as needed.   [DISCONTINUED] Vitamin D, Ergocalciferol, (DRISDOL) 1.25 MG (50000 UNIT) CAPS capsule Take 1 capsule (50,000 Units total) by mouth every 7 (seven) days.   No facility-administered medications prior to visit.    Review of Systems  Constitutional:  Positive for appetite change and fatigue.  Neurological:  Positive for headaches.  Psychiatric/Behavioral:  Positive for decreased concentration and sleep disturbance. The patient is nervous/anxious.     Objective    BP 110/88 (BP Location: Left Arm, Patient Position: Sitting, Cuff Size: Normal)   Pulse 89   Temp 98.4 F (36.9 C) (Oral)   Ht 5\' 1"  (1.549 m)   Wt 113 lb (51.3 kg)   SpO2 100%   BMI 21.35 kg/m   Physical Exam Vitals and nursing note reviewed.  Constitutional:      General: She is awake. She is not in acute distress.    Appearance: Normal appearance. She is well-developed, well-groomed and normal Bowers. She is not ill-appearing, toxic-appearing or diaphoretic.  HENT:     Head: Normocephalic and atraumatic.     Jaw: There is normal jaw occlusion. No trismus, tenderness, swelling or pain on movement.     Right Ear: Hearing, tympanic membrane, ear canal and external ear normal. There is no impacted  cerumen.     Left Ear: Hearing, tympanic membrane, ear canal and external ear normal. There is no impacted cerumen.     Nose: Nose normal. No congestion or rhinorrhea.     Right Turbinates: Not enlarged, swollen or pale.     Left Turbinates: Not enlarged, swollen or pale.     Right Sinus: No maxillary sinus tenderness or frontal sinus tenderness.     Left Sinus: No maxillary sinus tenderness or  frontal sinus tenderness.     Mouth/Throat:     Lips: Pink.     Mouth: Mucous membranes are moist. No injury.     Tongue: No lesions.     Pharynx: Oropharynx is clear. Uvula midline. No pharyngeal swelling, oropharyngeal exudate, posterior oropharyngeal erythema or uvula swelling.     Tonsils: No tonsillar exudate or tonsillar abscesses.  Eyes:     General: Lids are normal. Lids are everted, no foreign bodies appreciated. Vision grossly intact. Gaze aligned appropriately. No allergic shiner or visual field deficit.       Right eye: No discharge.        Left eye: No discharge.     Extraocular Movements: Extraocular movements intact.     Conjunctiva/sclera: Conjunctivae normal.     Right eye: Right conjunctiva is not injected. No exudate.    Left eye: Left conjunctiva is not injected. No exudate.    Pupils: Pupils are equal, round, and reactive to light.  Neck:     Thyroid: No thyroid mass, thyromegaly or thyroid tenderness.     Vascular: No carotid bruit.     Trachea: Trachea normal.  Cardiovascular:     Rate and Rhythm: Normal rate and regular rhythm.     Pulses: Normal pulses.          Carotid pulses are 2+ on the right side and 2+ on the left side.      Radial pulses are 2+ on the right side and 2+ on the left side.       Dorsalis pedis pulses are 2+ on the right side and 2+ on the left side.       Posterior tibial pulses are 2+ on the right side and 2+ on the left side.     Heart sounds: Normal heart sounds, S1 normal and S2 normal. No murmur heard.    No friction rub. No gallop.   Pulmonary:     Effort: Pulmonary effort is normal. No respiratory distress.     Breath sounds: Normal breath sounds and air entry. No stridor. No wheezing, rhonchi or rales.  Chest:     Chest wall: No tenderness.  Abdominal:     General: Abdomen is flat. Bowel sounds are normal. There is no distension.     Palpations: Abdomen is soft. There is no mass.     Tenderness: There is no abdominal tenderness. There is no right CVA tenderness, left CVA tenderness, guarding or rebound.     Hernia: No hernia is present.  Genitourinary:    Comments: Exam deferred; denies complaints Musculoskeletal:        General: No swelling, tenderness, deformity or signs of injury. Normal range of motion.     Cervical back: Full passive range of motion without pain, normal range of motion and neck supple. No edema, rigidity or tenderness. No muscular tenderness.     Right lower leg: No edema.     Left lower leg: No edema.  Lymphadenopathy:     Cervical: No cervical adenopathy.     Right cervical: No superficial, deep or posterior cervical adenopathy.    Left cervical: No superficial, deep or posterior cervical adenopathy.  Skin:    General: Skin is warm and dry.     Capillary Refill: Capillary refill takes less than 2 seconds.     Coloration: Skin is not jaundiced or pale.     Findings: No bruising, erythema, lesion or rash.  Neurological:     General: No focal deficit  present.     Mental Status: She is alert and oriented to person, place, and time. Mental status is at baseline.     GCS: GCS eye subscore is 4. GCS verbal subscore is 5. GCS motor subscore is 6.     Sensory: Sensation is intact. No sensory deficit.     Motor: Motor function is intact. No weakness.     Coordination: Coordination is intact. Coordination normal.     Gait: Gait is intact. Gait normal.  Psychiatric:        Attention and Perception: Attention and perception normal.        Mood and Affect: Mood is depressed. Affect is flat and  tearful.        Speech: Speech normal.        Behavior: Behavior normal. Behavior is cooperative.        Thought Content: Thought content normal.        Cognition and Memory: Cognition and memory normal.        Judgment: Judgment normal.     Last depression screening scores    03/19/2023   11:37 AM 03/18/2023    2:00 PM 03/18/2023    1:50 PM  PHQ 2/9 Scores  PHQ - 2 Score 4 5 5   PHQ- 9 Score 15 17 17    Last fall risk screening    03/18/2023    2:00 PM  Fall Risk   Falls in the past year? 0  Number falls in past yr: 0  Injury with Fall? 0  Risk for fall due to : No Fall Risks   Last Audit-C alcohol use screening    03/18/2023    1:51 PM  Alcohol Use Disorder Test (AUDIT)  1. How often do you have a drink containing alcohol? 2  2. How many drinks containing alcohol do you have on a typical day when you are drinking? 1  3. How often do you have six or more drinks on one occasion? 1  AUDIT-C Score 4   A score of 3 or more in women, and 4 or more in men indicates increased risk for alcohol abuse, EXCEPT if all of the points are from question 1   Results for orders placed or performed in visit on 03/18/23  Comprehensive Metabolic Panel (CMET)  Result Value Ref Range   Glucose 98 70 - 99 mg/dL   BUN 10 6 - 20 mg/dL   Creatinine, Ser 1.610.74 0.57 - 1.00 mg/dL   eGFR 096109 >04>59 VW/UJW/1.19mL/min/1.73   BUN/Creatinine Ratio 14 9 - 23   Sodium 139 134 - 144 mmol/L   Potassium 3.6 3.5 - 5.2 mmol/L   Chloride 101 96 - 106 mmol/L   CO2 23 20 - 29 mmol/L   Calcium 9.4 8.7 - 10.2 mg/dL   Total Protein 7.0 6.0 - 8.5 g/dL   Albumin 4.7 3.9 - 4.9 g/dL   Globulin, Total 2.3 1.5 - 4.5 g/dL   Albumin/Globulin Ratio 2.0 1.2 - 2.2   Bilirubin Total 0.3 0.0 - 1.2 mg/dL   Alkaline Phosphatase 55 44 - 121 IU/L   AST 11 0 - 40 IU/L   ALT 9 0 - 32 IU/L  Lipid panel  Result Value Ref Range   Cholesterol, Total 172 100 - 199 mg/dL   Triglycerides 147215 (H) 0 - 149 mg/dL   HDL 55 >82>39 mg/dL   VLDL Cholesterol  Cal 36 5 - 40 mg/dL   LDL Chol Calc (NIH) 81 0 - 99 mg/dL  Chol/HDL Ratio 3.1 0.0 - 4.4 ratio  Vitamin D (25 hydroxy)  Result Value Ref Range   Vit D, 25-Hydroxy 19.7 (L) 30.0 - 100.0 ng/mL  CBC with Differential/Platelet  Result Value Ref Range   WBC 8.7 3.4 - 10.8 x10E3/uL   RBC 4.08 3.77 - 5.28 x10E6/uL   Hemoglobin 13.3 11.1 - 15.9 g/dL   Hematocrit 16.1 09.6 - 46.6 %   MCV 95 79 - 97 fL   MCH 32.6 26.6 - 33.0 pg   MCHC 34.3 31.5 - 35.7 g/dL   RDW 04.5 40.9 - 81.1 %   Platelets 310 150 - 450 x10E3/uL   Neutrophils 51 Not Estab. %   Lymphs 36 Not Estab. %   Monocytes 7 Not Estab. %   Eos 5 Not Estab. %   Basos 1 Not Estab. %   Neutrophils Absolute 4.5 1.4 - 7.0 x10E3/uL   Lymphocytes Absolute 3.1 0.7 - 3.1 x10E3/uL   Monocytes Absolute 0.6 0.1 - 0.9 x10E3/uL   EOS (ABSOLUTE) 0.5 (H) 0.0 - 0.4 x10E3/uL   Basophils Absolute 0.0 0.0 - 0.2 x10E3/uL   Immature Granulocytes 0 Not Estab. %   Immature Grans (Abs) 0.0 0.0 - 0.1 x10E3/uL    Assessment & Plan    Routine Health Maintenance and Physical Exam  Exercise Activities and Dietary recommendations  Goals      Manage My Emotions     Timeframe:  Short-Term Goal Priority:  High Start Date:      01/08/21                       Expected End Date:  05/08/21                 Follow Up Date 02/02/2021    - continue personal grief counseling-weekly - start or continue a personal journal - talk about feelings with a friend, family or spiritual advisor    Why is this important?   When you are stressed, down or upset, your body reacts too.  For example, your blood pressure may get higher; you may have a headache or stomachache.  When your emotions get the best of you, your body's ability to fight off cold and flu gets weak.  These steps will help you manage your emotions.     Notes:      Resources for mental health follow up     Activities and task to complete in order to accomplish goals.   Mental Health Resources  provided-please review and call for additional information  to schedule your counseling appointment if desired Patient also agreeable to reviewing the website for Psychology Today for additional mental health resources Continue with compliance of taking medication prescribed by Doctor .               Immunization History  Administered Date(s) Administered   Influenza-Unspecified 09/09/2016    Health Maintenance  Topic Date Due   COVID-19 Vaccine (1) Never done   DTaP/Tdap/Td (1 - Tdap) Never done   PAP SMEAR-Modifier  12/17/2018   INFLUENZA VACCINE  07/10/2023   Hepatitis C Screening  Completed   HIV Screening  Completed   HPV VACCINES  Aged Out    Discussed health benefits of physical activity, and encouraged her to engage in regular exercise appropriate for her age and condition.  Problem List Items Addressed This Visit       Other   Insomnia (Chronic)    Chronic, variable continues to  use 100-150 mg trazodone to assist as needed      Relevant Orders   Ambulatory referral to Psychiatry   Ambulatory referral to Psychology   Annual physical exam - Primary    Things to do to keep yourself healthy  - Exercise at least 30-45 minutes a day, 3-4 days a week.  - Eat a low-fat diet with lots of fruits and vegetables, up to 7-9 servings per day.  - Seatbelts can save your life. Wear them always.  - Smoke detectors on every level of your home, check batteries every year.  - Eye Doctor - have an eye exam every 1-2 years  - Safe sex - if you may be exposed to STDs, use a condom.  - Alcohol -  If you drink, do it moderately, less than 2 drinks per day.  - Health Care Power of Attorney. Choose someone to speak for you if you are not able.  - Depression is common in our stressful world.If you're feeling down or losing interest in things you normally enjoy, please come in for a visit.  - Violence - If anyone is threatening or hurting you, please call immediately. Encourage PAP  smear by GYN      Relevant Orders   Comprehensive Metabolic Panel (CMET) (Completed)   Lipid panel (Completed)   Vitamin D (25 hydroxy) (Completed)   CBC with Differential/Platelet (Completed)   Anxiety    Chronic, variable Denies feelings of losing control Plans to reconnect with counselor Previously maintained with use of zoloft 200 mg and PRN xanax at 0.25 mg       Relevant Orders   Ambulatory referral to Psychiatry   Ambulatory referral to Psychology   Avitaminosis D    Historical; previously on supplementation Repeat labs iso chronic depression and anxiety      Relevant Orders   Vitamin D (25 hydroxy) (Completed)   Fatigue due to depression    Chronic, variable Recommend full labs with CPE      Relevant Orders   Ambulatory referral to Psychiatry   Ambulatory referral to Psychology   Grief at loss of child    Chronic, ongoing Worse around holidays, birthdays, major events etc      Relevant Orders   Ambulatory referral to Psychiatry   Ambulatory referral to Psychology   Major depression, recurrent, chronic    Chronic, major depression, recurrent, without SI/HI Previously maintained on 200 zoloft; however, endorses some influx of symptoms into every day life and wishes to reconnect with therapists/counselors to assist       Relevant Orders   Ambulatory referral to Psychiatry   Ambulatory referral to Psychology   Return in about 1 year (around 03/17/2024) for annual examination.    Leilani MerlI, Madeleine Fenn T Nelva Hauk, FNP, have reviewed all documentation for this visit. The documentation on 03/21/23 for the exam, diagnosis, procedures, and orders are all accurate and complete.  Jacky KindleElise T Timarie Labell, FNP  [facility] [phone] (phone) [phone] (fax)  [facility]

## 2023-03-18 ENCOUNTER — Encounter: Payer: Self-pay | Admitting: Family Medicine

## 2023-03-18 ENCOUNTER — Ambulatory Visit: Payer: Managed Care, Other (non HMO) | Admitting: Family Medicine

## 2023-03-18 VITALS — BP 110/88 | HR 89 | Temp 98.4°F | Ht 61.0 in | Wt 113.0 lb

## 2023-03-18 DIAGNOSIS — F339 Major depressive disorder, recurrent, unspecified: Secondary | ICD-10-CM | POA: Diagnosis not present

## 2023-03-18 DIAGNOSIS — F4321 Adjustment disorder with depressed mood: Secondary | ICD-10-CM | POA: Diagnosis not present

## 2023-03-18 DIAGNOSIS — G4709 Other insomnia: Secondary | ICD-10-CM

## 2023-03-18 DIAGNOSIS — E559 Vitamin D deficiency, unspecified: Secondary | ICD-10-CM | POA: Diagnosis not present

## 2023-03-18 DIAGNOSIS — F32A Depression, unspecified: Secondary | ICD-10-CM

## 2023-03-18 DIAGNOSIS — F419 Anxiety disorder, unspecified: Secondary | ICD-10-CM | POA: Diagnosis not present

## 2023-03-18 DIAGNOSIS — Z634 Disappearance and death of family member: Secondary | ICD-10-CM

## 2023-03-18 DIAGNOSIS — R5383 Other fatigue: Secondary | ICD-10-CM

## 2023-03-18 DIAGNOSIS — Z0000 Encounter for general adult medical examination without abnormal findings: Secondary | ICD-10-CM

## 2023-03-18 NOTE — Patient Instructions (Signed)
[email or web address]  Utilize filters to meet gender, insurance, virtual vs in person

## 2023-03-19 ENCOUNTER — Telehealth: Payer: Self-pay | Admitting: *Deleted

## 2023-03-19 ENCOUNTER — Other Ambulatory Visit: Payer: Self-pay | Admitting: Family Medicine

## 2023-03-19 DIAGNOSIS — E559 Vitamin D deficiency, unspecified: Secondary | ICD-10-CM

## 2023-03-19 LAB — LIPID PANEL
Chol/HDL Ratio: 3.1 ratio (ref 0.0–4.4)
Cholesterol, Total: 172 mg/dL (ref 100–199)
HDL: 55 mg/dL (ref >39)
LDL Chol Calc (NIH): 81 mg/dL (ref 0–99)
Triglycerides: 215 mg/dL — ABNORMAL HIGH (ref 0–149)
VLDL Cholesterol Cal: 36 mg/dL (ref 5–40)

## 2023-03-19 LAB — CBC WITH DIFFERENTIAL/PLATELET
Basophils Absolute: 0 10*3/uL (ref 0.0–0.2)
Basos: 1 %
EOS (ABSOLUTE): 0.5 10*3/uL — ABNORMAL HIGH (ref 0.0–0.4)
Eos: 5 %
Hematocrit: 38.8 % (ref 34.0–46.6)
Hemoglobin: 13.3 g/dL (ref 11.1–15.9)
Immature Grans (Abs): 0 10*3/uL (ref 0.0–0.1)
Immature Granulocytes: 0 %
Lymphocytes Absolute: 3.1 10*3/uL (ref 0.7–3.1)
Lymphs: 36 %
MCH: 32.6 pg (ref 26.6–33.0)
MCHC: 34.3 g/dL (ref 31.5–35.7)
MCV: 95 fL (ref 79–97)
Monocytes Absolute: 0.6 10*3/uL (ref 0.1–0.9)
Monocytes: 7 %
Neutrophils Absolute: 4.5 10*3/uL (ref 1.4–7.0)
Neutrophils: 51 %
Platelets: 310 10*3/uL (ref 150–450)
RBC: 4.08 x10E6/uL (ref 3.77–5.28)
RDW: 12.3 % (ref 11.7–15.4)
WBC: 8.7 10*3/uL (ref 3.4–10.8)

## 2023-03-19 LAB — COMPREHENSIVE METABOLIC PANEL
ALT: 9 IU/L (ref 0–32)
AST: 11 IU/L (ref 0–40)
Albumin/Globulin Ratio: 2 (ref 1.2–2.2)
Albumin: 4.7 g/dL (ref 3.9–4.9)
Alkaline Phosphatase: 55 IU/L (ref 44–121)
BUN/Creatinine Ratio: 14 (ref 9–23)
BUN: 10 mg/dL (ref 6–20)
Bilirubin Total: 0.3 mg/dL (ref 0.0–1.2)
CO2: 23 mmol/L (ref 20–29)
Calcium: 9.4 mg/dL (ref 8.7–10.2)
Chloride: 101 mmol/L (ref 96–106)
Creatinine, Ser: 0.74 mg/dL (ref 0.57–1.00)
Globulin, Total: 2.3 g/dL (ref 1.5–4.5)
Glucose: 98 mg/dL (ref 70–99)
Potassium: 3.6 mmol/L (ref 3.5–5.2)
Sodium: 139 mmol/L (ref 134–144)
Total Protein: 7 g/dL (ref 6.0–8.5)
eGFR: 109 mL/min/{1.73_m2} (ref >59)

## 2023-03-19 LAB — VITAMIN D 25 HYDROXY (VIT D DEFICIENCY, FRACTURES): Vit D, 25-Hydroxy: 19.7 ng/mL — ABNORMAL LOW (ref 30.0–100.0)

## 2023-03-19 MED ORDER — VITAMIN D (ERGOCALCIFEROL) 1.25 MG (50000 UNIT) PO CAPS: 50000.0000 [IU] | ORAL_CAPSULE | ORAL | 1 refills | Status: DC

## 2023-03-19 NOTE — Patient Instructions (Signed)
  Visit Information  Thank you for taking time to visit with me today. Please don't hesitate to contact me if I can be of assistance to you.   Following are the goals we discussed today:   Mental Health Resources provided-please review and call for additional information  to schedule your counseling appointment if desired Patient also agreeable to reviewing the website for Psychology Today for additional mental health resources Continue with compliance of taking medication prescribed by Doctor .     If you are experiencing a Mental Health or Behavioral Health Crisis or need someone to talk to, please call the Suicide and Crisis Lifeline: 988   Patient verbalizes understanding of instructions and care plan provided today and agrees to view in MyChart. Active MyChart status and patient understanding of how to access instructions and care plan via MyChart confirmed with patient.     No further follow up required: patient to contact this Child psychotherapist with any additional community resource needs  [employer], LCSW Clinical Social Worker  [facility] [phone]

## 2023-03-19 NOTE — Progress Notes (Signed)
Cholesterol is improved both total and bad/LDL. Fats remain slightly elevated. I continue to recommend diet low in saturated fat and regular exercise - 30 min at least 5 times per week  Vit D remains low; however, improved. Continue supplementation for an additional 3-6 months.  Other labs remains normal and stable,  Jacky Kindle, FNP  [facility] [address] [place], [place] [address] [phone] (phone) [phone] (fax) [facility]

## 2023-03-19 NOTE — Patient Outreach (Signed)
  Care Coordination   Initial Visit Note   03/19/2023 Name: Annette Bowers MRN: [id number] DOB: 1986/07/23  Annette Bowers is a 37 y.o. year old female who sees Jacky Kindle, FNP for primary care. I spoke with  Annette Bowers by phone today.  What matters to the patients health and wellness today?  Mental Health Resources for ongoing therapy. Patient continues to report symptoms of depression related to multiple family losses.      SDOH assessments and interventions completed:  Yes  SDOH Interventions Today    Flowsheet Row Most Recent Value  SDOH Interventions   Food Insecurity Interventions Intervention Not Indicated  Housing Interventions Intervention Not Indicated  Depression Interventions/Treatment  Counseling, Medication        Care Coordination Interventions:  Yes, provided  Interventions Today    Flowsheet Row Most Recent Value  Chronic Disease   Chronic disease during today's visit Other  [Depression]  General Interventions   General Interventions Discussed/Reviewed General Interventions Discussed, Community Resources  Mental Health Interventions   Mental Health Discussed/Reviewed Mental Health Discussed, Depression  [Ongoing Mental Health follow up encouraged-resources provided for [facility] [phone] and [facility] [phone] also reviewing Psychology tToday website]  Safety Interventions   Safety Discussed/Reviewed Safety Discussed  [patient encouraged to contact crisis line [phone] in the event of a mental health crisis]       Follow up plan: No further intervention required.   Encounter Outcome:  Pt. Visit Completed

## 2023-03-21 ENCOUNTER — Encounter: Payer: Self-pay | Admitting: Family Medicine

## 2023-03-21 DIAGNOSIS — E559 Vitamin D deficiency, unspecified: Secondary | ICD-10-CM | POA: Insufficient documentation

## 2023-03-21 DIAGNOSIS — Z634 Disappearance and death of family member: Secondary | ICD-10-CM | POA: Insufficient documentation

## 2023-03-21 NOTE — Assessment & Plan Note (Signed)
Chronic, ongoing Worse around holidays, birthdays, major events etc

## 2023-03-21 NOTE — Assessment & Plan Note (Signed)
Chronic, variable Recommend full labs with CPE

## 2023-03-21 NOTE — Assessment & Plan Note (Signed)
Chronic, major depression, recurrent, without SI/HI Previously maintained on 200 zoloft; however, endorses some influx of symptoms into every day life and wishes to reconnect with therapists/counselors to assist

## 2023-03-21 NOTE — Assessment & Plan Note (Signed)
Chronic, variable continues to use 100-150 mg trazodone to assist as needed

## 2023-03-21 NOTE — Assessment & Plan Note (Signed)
Historical; previously on supplementation Repeat labs iso chronic depression and anxiety

## 2023-03-21 NOTE — Assessment & Plan Note (Signed)

## 2023-03-21 NOTE — Assessment & Plan Note (Signed)
Chronic, variable Denies feelings of losing control Plans to reconnect with counselor Previously maintained with use of zoloft 200 mg and PRN xanax at 0.25 mg

## 2023-04-18 ENCOUNTER — Other Ambulatory Visit: Payer: Self-pay | Admitting: Family Medicine

## 2023-04-18 DIAGNOSIS — F419 Anxiety disorder, unspecified: Secondary | ICD-10-CM

## 2023-04-18 NOTE — Telephone Encounter (Signed)
Requested medication (s) are due for refill today: yes  Requested medication (s) are on the active medication list: yes  Last refill:  09/19/22  Future visit scheduled: yes  Notes to clinic:  Unable to refill per protocol, cannot delegate.      Requested Prescriptions  Pending Prescriptions Disp Refills   ALPRAZolam (XANAX) 0.25 MG tablet [Pharmacy Med Name: ALPRAZOLAM 0.25MG  TABLETS] 30 tablet     Sig: TAKE 1 TABLET(0.25 MG) BY MOUTH DAILY AS NEEDED FOR ANXIETY     Not Delegated - Psychiatry: Anxiolytics/Hypnotics 2 Failed - 04/18/2023 10:43 AM      Failed - This refill cannot be delegated      Failed - Urine Drug Screen completed in last 360 days      Passed - Patient is not pregnant      Passed - Valid encounter within last 6 months    Recent Outpatient Visits           1 month ago Annual physical exam   [place] [facility] Merita Norton T, FNP   7 months ago Major depression, recurrent, chronic [facility])   [place] [facility] Jacky Kindle, FNP   1 year ago Other insomnia   [place] [facility] Merita Norton T, FNP   1 year ago Major depression, recurrent, chronic [facility])   [place] [facility] Merita Norton T, FNP   2 years ago Anxiety   [facility] Just, Azalee Course, FNP       Future Appointments             In 11 months Jacky Kindle, FNP [facility]

## 2023-04-21 ENCOUNTER — Telehealth: Payer: Self-pay | Admitting: Family Medicine

## 2023-04-21 NOTE — Telephone Encounter (Signed)
Pending provider review -sent 04/18/23

## 2023-04-21 NOTE — Telephone Encounter (Signed)
Medication Refill - Medication: Aprazolam Prudy Feeler) 0.25  Denied , says unable ot delegate protocol.. pt is out of this med  Has the patient contacted their pharmacy? yes (Agent: If no, request that the patient contact the pharmacy for the refill. If patient does not wish to contact the pharmacy document the reason why and proceed with request.) (Agent: If yes, when and what did the pharmacy advise?)contact pcp  Preferred Pharmacy (with phone number or street name):  [facility][id number] - [place], [place] - [address] AT [facility] OF [address] [facility] Phone: [phone]  Fax: [phone]     Has the patient been seen for an appointment in the last year OR does the patient have an upcoming appointment? yes  Agent: Please be advised that RX refills may take up to 3 business days. We ask that you follow-up with your pharmacy.

## 2023-05-24 ENCOUNTER — Other Ambulatory Visit: Payer: Self-pay | Admitting: Family Medicine

## 2023-05-24 DIAGNOSIS — F419 Anxiety disorder, unspecified: Secondary | ICD-10-CM

## 2023-05-26 NOTE — Telephone Encounter (Signed)
Requested medication (s) are due for refill today: Yes  Requested medication (s) are on the active medication list: Yes  Last refill:  04/21/23  Future visit scheduled: Yes  Notes to clinic:  Not delegated.    Requested Prescriptions  Pending Prescriptions Disp Refills   ALPRAZolam (XANAX) 0.25 MG tablet [Pharmacy Med Name: ALPRAZOLAM 0.25MG  TABLETS] 30 tablet     Sig: TAKE 1 TABLET(0.25 MG) BY MOUTH DAILY AS NEEDED FOR ANXIETY     Not Delegated - Psychiatry: Anxiolytics/Hypnotics 2 Failed - 05/24/2023  2:34 PM      Failed - This refill cannot be delegated      Failed - Urine Drug Screen completed in last 360 days      Passed - Patient is not pregnant      Passed - Valid encounter within last 6 months    Recent Outpatient Visits           2 months ago Annual physical exam   [place] [facility] Merita Norton T, FNP   8 months ago Major depression, recurrent, chronic [facility])   [place] [facility] Jacky Kindle, FNP   1 year ago Other insomnia   [place] [facility] Merita Norton T, FNP   1 year ago Major depression, recurrent, chronic [facility])   [place] [facility] Merita Norton T, FNP   2 years ago Anxiety   [facility] Just, Azalee Course, FNP       Future Appointments             In 9 months Jacky Kindle, FNP [facility], [facility]

## 2023-05-27 ENCOUNTER — Other Ambulatory Visit: Payer: Self-pay | Admitting: Family Medicine

## 2023-05-27 DIAGNOSIS — F419 Anxiety disorder, unspecified: Secondary | ICD-10-CM

## 2023-05-27 NOTE — Telephone Encounter (Signed)
Requested medication (s) are due for refill today: yes  Requested medication (s) are on the active medication list: yes    Last refill: 04/21/23  #30  0 refills  Future visit scheduled yes  03/18/24  Notes to clinic:Not delegated, please review. Thank you.  Requested Prescriptions  Pending Prescriptions Disp Refills   ALPRAZolam (XANAX) 0.25 MG tablet 30 tablet 0     Not Delegated - Psychiatry: Anxiolytics/Hypnotics 2 Failed - 05/27/2023  4:13 PM      Failed - This refill cannot be delegated      Failed - Urine Drug Screen completed in last 360 days      Passed - Patient is not pregnant      Passed - Valid encounter within last 6 months    Recent Outpatient Visits           2 months ago Annual physical exam   [place] [facility] T, FNP   8 months ago Major depression, recurrent, chronic [facility])   [facility] Jacky Kindle, FNP   1 year ago Other insomnia   [place] [facility] Merita Norton T, FNP   1 year ago Major depression, recurrent, chronic [facility])   [place] [facility] Merita Norton T, FNP   2 years ago Anxiety   [place] [facility] Just, Azalee Course, FNP       Future Appointments             In 9 months Jacky Kindle, FNP [facility], [facility]

## 2023-05-27 NOTE — Telephone Encounter (Signed)
Please advise 

## 2023-05-27 NOTE — Telephone Encounter (Signed)
Medication Refill - Medication:  ALPRAZolam (0.25 MG) Pt checking status of refill, says not delegated not sure if this is denial?  Has the patient contacted their pharmacy? yes (Agent: If no, request that the patient contact the pharmacy for the refill. If patient does not wish to contact the pharmacy document the reason why and proceed with request.) (Agent: If yes, when and what did the pharmacy advise?)contact pcp  Preferred Pharmacy (with phone number or street name):  [facility][id number] - [place], [place] - [address] AT [facility] OF [address] [facility] Phone: [phone]  Fax: [phone]     Has the patient been seen for an appointment in the last year OR does the patient have an upcoming appointment? yes  Agent: Please be advised that RX refills may take up to 3 business days. We ask that you follow-up with your pharmacy.

## 2023-09-21 ENCOUNTER — Other Ambulatory Visit: Payer: Self-pay | Admitting: Family Medicine

## 2023-09-21 DIAGNOSIS — F419 Anxiety disorder, unspecified: Secondary | ICD-10-CM

## 2023-09-22 NOTE — Telephone Encounter (Signed)
Requested medications are due for refill today.  yes  Requested medications are on the active medications list.  yes  Last refill. 05/27/2023 #30 3 rf  Future visit scheduled.   yes  Notes to clinic.  Refill not delegated.    Requested Prescriptions  Pending Prescriptions Disp Refills   ALPRAZolam (XANAX) 0.25 MG tablet [Pharmacy Med Name: ALPRAZOLAM 0.25MG  TABLETS] 30 tablet     Sig: TAKE 1 TABLET(0.25 MG) BY MOUTH DAILY AS NEEDED FOR ANXIETY     Not Delegated - Psychiatry: Anxiolytics/Hypnotics 2 Failed - 09/21/2023  1:33 PM      Failed - This refill cannot be delegated      Failed - Urine Drug Screen completed in last 360 days      Failed - Valid encounter within last 6 months    Recent Outpatient Visits           6 months ago Annual physical exam   [place] [facility] Mer[facility] Norton T, FNP   1 year ago Major depression, recurrent, chronic [facility])   [place] [facility] Mer[facility] Norton T, FNP   2 years ago Other insomnia   [place] [facility] Merita Norton T, FNP   2 years ago Major depression, recurrent, chronic [facility])   [place] [facility] Merita Norton T, FNP   2 years ago Anxiety   [place] [facility] Just, Azalee Course, FNP       Future Appointments             In 5 months Jacky Kindle, FNP [facility], [facility]            Passed - Patient is not pregnant

## 2023-09-23 ENCOUNTER — Ambulatory Visit: Payer: Self-pay | Admitting: *Deleted

## 2023-09-23 ENCOUNTER — Other Ambulatory Visit: Payer: Self-pay | Admitting: *Deleted

## 2023-09-23 ENCOUNTER — Telehealth: Payer: Self-pay | Admitting: Family Medicine

## 2023-09-23 ENCOUNTER — Other Ambulatory Visit: Payer: Self-pay | Admitting: Family Medicine

## 2023-09-23 MED ORDER — SERTRALINE HCL 100 MG PO TABS: 200.0000 mg | ORAL_TABLET | Freq: Every day | ORAL | 0 refills | Status: DC

## 2023-09-23 NOTE — Telephone Encounter (Signed)
Medication Refill - Medication:   sertraline (ZOLOFT) 100 MG tablet [[id number]]    (need 90 day supply)  ( patient states she is out of meds)      Has the patient contacted their pharmacy? Yes.    Preferred Pharmacy (with phone number or street name):  sertraline (ZOLOFT) 100 MG tablet [[id number]]    Has the patient been seen for an appointment in the last year OR does the patient have an upcoming appointment? Yes.    Agent: Please be advised that RX refills may take up to 3 business days. We ask that you follow-up with your pharmacy.

## 2023-09-23 NOTE — Telephone Encounter (Signed)
OPENED IN ERROR

## 2023-09-23 NOTE — Telephone Encounter (Signed)
  Chief Complaint: ran out of medication zoloft and feeling "woozy" Symptoms: out of medication since Saturday now feeling woozy  Frequency: today  Pertinent Negatives: Patient denies chest pain no difficulty breathing no anxiety  Disposition: [] ED /[] Urgent Care (no appt availability in office) / [] Appointment(In office/virtual)/ []  [place] [facility]/ [x] Home Care/ [] Refused Recommended Disposition /[] [place] [facility]/ []  Follow-up with PCP Additional Notes:   Ran out of medication zoloft since Saturday . Please reorder. Last OV 03/18/23     Reason for Disposition  Dizziness caused by not drinking enough fluids  Answer Assessment - Initial Assessment Questions 1. DESCRIPTION: "Describe your dizziness."     Feels "woozy" 2. LIGHTHEADED: "Do you feel lightheaded?" (e.g., somewhat faint, woozy, weak upon standing)     Woozy  3. VERTIGO: "Do you feel like either you or the room is spinning or tilting?" (i.e. vertigo)     na 4. SEVERITY: "How bad is it?"  "Do you feel like you are going to faint?" "Can you stand and walk?"   - MILD: Feels slightly dizzy, but walking normally.   - MODERATE: Feels unsteady when walking, but not falling; interferes with normal activities (e.g., school, work).   - SEVERE: Unable to walk without falling, or requires assistance to walk without falling; feels like passing out now.      Feels woozy 5. ONSET:  "When did the dizziness begin?"     Na  6. AGGRAVATING FACTORS: "Does anything make it worse?" (e.g., standing, change in head position)     Na  7. HEART RATE: "Can you tell me your heart rate?" "How many beats in 15 seconds?"  (Note: not all patients can do this)       na 8. CAUSE: "What do you think is causing the dizziness?"     Ran out of medication 9. RECURRENT SYMPTOM: "Have you had dizziness before?" If Yes, ask: "When was the last time?" "What happened that time?"     Na  10. OTHER SYMPTOMS: "Do you have any other symptoms?" (e.g.,  fever, chest pain, vomiting, diarrhea, bleeding)       Woozy feeling , ran out of medication zoloft 200 mg daily since Saturday. 11. PREGNANCY: "Is there any chance you are pregnant?" "When was your last menstrual period?"       na  Protocols used: Dizziness - Lightheadedness-A-AH

## 2023-09-23 NOTE — Telephone Encounter (Signed)
Requested medication (s) are due for refill today: expired medication  Requested medication (s) are on the active medication list: yes   Last refill:  09/19/22 #180 3 refills  Future visit scheduled:  yes in 5 months   Notes to clinic:   expired medication . Last OV 03/18/23 . Do you want to renew Rx?  Patient out of medication      Requested Prescriptions  Pending Prescriptions Disp Refills   sertraline (ZOLOFT) 100 MG tablet 60 tablet 0    Sig: Take 2 tablets (200 mg total) by mouth daily.     Psychiatry:  Antidepressants - SSRI - sertraline Failed - 09/23/2023  9:35 AM      Failed - Valid encounter within last 6 months    Recent Outpatient Visits           6 months ago Annual physical exam   [place] [facility] Merita Norton T, FNP   1 year ago Major depression, recurrent, chronic [facility])   [place] [facility] Mer[facility] Norton T, FNP   2 years ago Other insomnia   [place] [facility] Mer[facility] Norton T, FNP   2 years ago Major depression, recurrent, chronic [facility])   [place] [facility] Merita Norton T, FNP   2 years ago Anxiety   [place] [facility] Just, Azalee Course, FNP       Future Appointments             In 5 months Jacky Kindle, FNP  [facility], [facility]            Passed - AST in normal range and within 360 days    AST  Date Value Ref Range Status  03/18/2023 11 0 - 40 IU/L Final   SGOT(AST)  Date Value Ref Range Status  09/01/2013 19 15 - 37 Unit/L Final         Passed - ALT in normal range and within 360 days    ALT  Date Value Ref Range Status  03/18/2023 9 0 - 32 IU/L Final   SGPT (ALT)  Date Value Ref Range Status  09/01/2013 21 12 - 78 U/L Final         Passed - Completed PHQ-2 or PHQ-9 in the last 360 days

## 2023-09-24 ENCOUNTER — Telehealth: Payer: Self-pay

## 2023-09-24 ENCOUNTER — Telehealth: Payer: Self-pay | Admitting: Family Medicine

## 2023-09-24 ENCOUNTER — Encounter: Payer: Self-pay | Admitting: Family Medicine

## 2023-09-24 ENCOUNTER — Other Ambulatory Visit: Payer: Self-pay | Admitting: Family Medicine

## 2023-09-24 DIAGNOSIS — F339 Major depressive disorder, recurrent, unspecified: Secondary | ICD-10-CM

## 2023-09-24 MED ORDER — SERTRALINE HCL 100 MG PO TABS: 200.0000 mg | ORAL_TABLET | Freq: Every day | ORAL | 1 refills | Status: DC

## 2023-09-24 NOTE — Telephone Encounter (Signed)
,   FNP)      09/24/23  8:54 AM Good morning, I am sorry to bother you directly however I just wanted to document that my sertraline still has not been filled correctly by the pharmacy.  I called multiple times yesterday to request the RX to be sent to [facility] in [place] for a 90 day supply, since my insurance will not cover a 30 day supply. I called the pharmacy multiple times as well to follow up until finally the pharmacist called yesterday evening and confirmed the new RX has not been signed off on yet. I have not had a full dose of this medication since Saturday the 12th. I hope to be able to get this medicine soon for the 90 supply as required by insurance. Have a nice day . Thank you, Annette Bowers     2ND REQUEST.Marland KitchenPlease try to take care of this as soon as you can. Patient is crying and upset.Thank you

## 2023-09-24 NOTE — Telephone Encounter (Signed)
Pt called crying asking for 90 day of her Zoloft. Pt stated it her insurance will only refill 90 days worth. Pt stated she called yesterday and sent pt message through MyChart. Called office and spoke with Joni Reining. Joni Reining stated she will send copy of pt's message to her PCP. Asked Joni Reining if she could talk to pt to help reassure her so she is talking to the person who will assist pt get her meds. Call warmm transferred to office.

## 2023-09-24 NOTE — Telephone Encounter (Signed)
Pharmacy received refill 09/23/23. Requested Prescriptions  Refused Prescriptions Disp Refills   sertraline (ZOLOFT) 100 MG tablet 60 tablet 0    Sig: Take 2 tablets (200 mg total) by mouth daily.     Psychiatry:  Antidepressants - SSRI - sertraline Failed - 09/23/2023  1:42 PM      Failed - Valid encounter within last 6 months    Recent Outpatient Visits           6 months ago Annual physical exam   [place] [facility] T, FNP   1 year ago Major depression, recurrent, chronic [facility])   [place] [facility] Merita Norton T, FNP   2 years ago Other insomnia   [place] [facility] Merita Norton T, FNP   2 years ago Major depression, recurrent, chronic [facility])   [place] [facility] Merita Norton T, FNP   2 years ago Anxiety   [place] [facility] Just, Azalee Course, FNP       Future Appointments             In 5 months Jacky Kindle, FNP [place] [facility], [facility]            Passed - AST in normal range and within 360 days    AST  Date Value Ref Range Status  03/18/2023 11 0 - 40 IU/L Final   SGOT(AST)  Date Value Ref Range Status  09/01/2013 19 15 - 37 Unit/L Final         Passed - ALT in normal range and within 360 days    ALT  Date Value Ref Range Status  03/18/2023 9 0 - 32 IU/L Final   SGPT (ALT)  Date Value Ref Range Status  09/01/2013 21 12 - 78 U/L Final         Passed - Completed PHQ-2 or PHQ-9 in the last 360 days

## 2023-09-24 NOTE — Telephone Encounter (Signed)
Attempted to reach pharmacy for verification of receipt.. Extended hold. Will attempt later.

## 2023-09-29 ENCOUNTER — Other Ambulatory Visit: Payer: Self-pay | Admitting: Family Medicine

## 2023-09-29 NOTE — Telephone Encounter (Signed)
Patient picked up 30 day supply. Per pharmacist moving forward new prescription for 90 days should be okay until is time for next refill.

## 2023-10-23 ENCOUNTER — Other Ambulatory Visit: Payer: Self-pay | Admitting: Family Medicine

## 2023-10-23 DIAGNOSIS — F419 Anxiety disorder, unspecified: Secondary | ICD-10-CM

## 2023-10-23 DIAGNOSIS — F339 Major depressive disorder, recurrent, unspecified: Secondary | ICD-10-CM

## 2023-10-23 NOTE — Telephone Encounter (Signed)
Requested medications are due for refill today.  yes  Requested medications are on the active medications list.  yes  Last refill. 09/23/2023 #30 0 rf  Future visit scheduled.   yes  Notes to clinic.  Refill not delegated.    Requested Prescriptions  Pending Prescriptions Disp Refills   ALPRAZolam (XANAX) 0.25 MG tablet [Pharmacy Med Name: ALPRAZOLAM 0.25MG  TABLETS] 30 tablet     Sig: TAKE 1 TABLET(0.25 MG) BY MOUTH DAILY AS NEEDED FOR ANXIETY. FOLLOW UP APPOINTMENT     Not Delegated - Psychiatry: Anxiolytics/Hypnotics 2 Failed - 10/23/2023  7:58 AM      Failed - This refill cannot be delegated      Failed - Urine Drug Screen completed in last 360 days      Failed - Valid encounter within last 6 months    Recent Outpatient Visits           7 months ago Annual physical exam   [place] [facility] Mer[facility] Norton T, FNP   1 year ago Major depression, recurrent, chronic [facility])   [place] [facility] Mer[facility] Norton T, FNP   2 years ago Other insomnia   [place] [facility] Merita Norton T, FNP   2 years ago Major depression, recurrent, chronic [facility])   [place] [facility] Mer[facility] Norton T, FNP   2 years ago Anxiety   [place] [facility] Just, Azalee Course, FNP       Future Appointments             In 4 months Jacky Kindle, FNP [place] [facility], [facility]            Passed - Patient is not pregnant      Refused Prescriptions Disp Refills   sertraline (ZOLOFT) 100 MG tablet [Pharmacy Med Name: SERTRALINE 100MG  TABLETS] 60 tablet     Sig: TAKE 2 TABLETS(200 MG) BY MOUTH DAILY     Psychiatry:  Antidepressants - SSRI - sertraline Failed - 10/23/2023  7:58 AM      Failed - Valid encounter within last 6 months    Recent Outpatient Visits           7 months ago Annual physical exam   [place] [facility] Merita Norton T, FNP   1 year ago Major  depression, recurrent, chronic [facility])   [place] [facility] Merita Norton T, FNP   2 years ago Other insomnia   [place] [facility] Mer[facility] Norton T, FNP   2 years ago Major depression, recurrent, chronic [facility])   [place] [facility] Merita Norton T, FNP   2 years ago Anxiety   [place] [facility] Just, Azalee Course, FNP       Future Appointments             In 4 months Jacky Kindle, FNP [place] [facility], [facility]            Passed - AST in normal range and within 360 days    AST  Date Value Ref Range Status  03/18/2023 11 0 - 40 IU/L Final   SGOT(AST)  Date Value Ref Range Status  09/01/2013 19 15 - 37 Unit/L Final         Passed - ALT in normal range and within 360 days    ALT  Date Value Ref Range Status  03/18/2023 9 0 -  32 IU/L Final   SGPT (ALT)  Date Value Ref Range Status  09/01/2013 21 12 - 78 U/L Final         Passed - Completed PHQ-2 or PHQ-9 in the last 360 days

## 2023-10-23 NOTE — Telephone Encounter (Signed)
Requested Prescriptions  Pending Prescriptions Disp Refills   ALPRAZolam (XANAX) 0.25 MG tablet [Pharmacy Med Name: ALPRAZOLAM 0.25MG  TABLETS] 30 tablet     Sig: TAKE 1 TABLET(0.25 MG) BY MOUTH DAILY AS NEEDED FOR ANXIETY. FOLLOW UP APPOINTMENT     Not Delegated - Psychiatry: Anxiolytics/Hypnotics 2 Failed - 10/23/2023  7:58 AM      Failed - This refill cannot be delegated      Failed - Urine Drug Screen completed in last 360 days      Failed - Valid encounter within last 6 months    Recent Outpatient Visits           7 months ago Annual physical exam   [place] [facility] Merita Norton T, FNP   1 year ago Major depression, recurrent, chronic [facility])   [place] [facility] Merita Norton T, FNP   2 years ago Other insomnia   [place] [facility] Merita Norton T, FNP   2 years ago Major depression, recurrent, chronic [facility])   [place] [facility] Merita Norton T, FNP   2 years ago Anxiety   [place] [facility] Just, Azalee Course, FNP       Future Appointments             In 4 months Jacky Kindle, FNP [place] [facility], [facility]            Passed - Patient is not pregnant       sertraline (ZOLOFT) 100 MG tablet [Pharmacy Med Name: SERTRALINE 100MG  TABLETS] 60 tablet     Sig: TAKE 2 TABLETS(200 MG) BY MOUTH DAILY     Psychiatry:  Antidepressants - SSRI - sertraline Failed - 10/23/2023  7:58 AM      Failed - Valid encounter within last 6 months    Recent Outpatient Visits           7 months ago Annual physical exam   [place] [facility] Merita Norton T, FNP   1 year ago Major depression, recurrent, chronic [facility])   [place] [facility] Merita Norton T, FNP   2 years ago Other insomnia   [place] [facility] Merita Norton T, FNP   2 years ago Major depression, recurrent, chronic [facility])    [facility] Merita Norton T, FNP   2 years ago Anxiety    [facility] Just, Azalee Course, FNP       Future Appointments             In 4 months Jacky Kindle, FNP  [facility], [facility]            Passed - AST in normal range and within 360 days    AST  Date Value Ref Range Status  03/18/2023 11 0 - 40 IU/L Final   SGOT(AST)  Date Value Ref Range Status  09/01/2013 19 15 - 37 Unit/L Final         Passed - ALT in normal range and within 360 days    ALT  Date Value Ref Range Status  03/18/2023 9 0 - 32 IU/L Final   SGPT (ALT)  Date Value Ref Range Status  09/01/2013 21 12 - 78 U/L Final         Passed - Completed PHQ-2 or PHQ-9 in the last 360 days

## 2023-10-24 ENCOUNTER — Other Ambulatory Visit: Payer: Self-pay | Admitting: Family Medicine

## 2023-10-24 DIAGNOSIS — F419 Anxiety disorder, unspecified: Secondary | ICD-10-CM

## 2023-10-24 DIAGNOSIS — F339 Major depressive disorder, recurrent, unspecified: Secondary | ICD-10-CM

## 2023-10-24 NOTE — Telephone Encounter (Signed)
Medication Refill -  Most Recent Primary Care Visit:  Provider: Merita Norton T  Department[facility]  Visit Type: PHYSICAL  Date: 03/18/2023  Medication: ALPRAZolam (XANAX) 0.25 MG tablet [[id number]]   sertraline (ZOLOFT) 100 MG tablet [[id number]]   Has the patient contacted their pharmacy? Yes (Agent: If no, request that the patient contact the pharmacy for the refill. If patient does not wish to contact the pharmacy document the reason why and proceed with request.) (Agent: If yes, when and what did the pharmacy advise?)  [facility] #[id number] [place], [place] - [address] AT [facility] OF [address] [facility] Phone: [phone]  Fax: [phone]      Is this the correct pharmacy for this prescription? Yes If no, delete pharmacy and type the correct one.  This is the patient's preferred pharmacy:   Has the prescription been filled recently? Yes  Is the patient out of the medication? No Pt states that she will run out of medication over the weekend.  Has the patient been seen for an appointment in the last year OR does the patient have an upcoming appointment? Yes  Can we respond through MyChart? Yes  Agent: Please be advised that Rx refills may take up to 3 business days. We ask that you follow-up with your pharmacy.

## 2023-10-27 ENCOUNTER — Other Ambulatory Visit: Payer: Self-pay | Admitting: Family Medicine

## 2023-10-27 DIAGNOSIS — F419 Anxiety disorder, unspecified: Secondary | ICD-10-CM

## 2023-10-27 NOTE — Telephone Encounter (Signed)
Requested medication (s) are due for refill today:   Provider to review  Requested medication (s) are on the active medication list:   Yes  Future visit scheduled:   Yes 03/18/2024   Last ordered: 09/21/2023 #30, 0 refills  Non delegated duplicated request   Requested Prescriptions  Pending Prescriptions Disp Refills   ALPRAZolam (XANAX) 0.25 MG tablet 30 tablet 0    Sig: Take 1 tablet (0.25 mg total) by mouth daily as needed for anxiety. Courtesy refill; due for follow up appt.     Not Delegated - Psychiatry: Anxiolytics/Hypnotics 2 Failed - 10/24/2023  1:27 PM      Failed - This refill cannot be delegated      Failed - Urine Drug Screen completed in last 360 days      Failed - Valid encounter within last 6 months    Recent Outpatient Visits           7 months ago Annual physical exam   [place] [facility] Merita Norton T, FNP   1 year ago Major depression, recurrent, chronic [facility])   [place] [facility] Merita Norton T, FNP   2 years ago Other insomnia   [facility] Merita Norton T, FNP   2 years ago Major depression, recurrent, chronic [facility])   [place] [facility] Jacky Kindle, FNP   2 years ago Anxiety   [facility] Just, Azalee Course, FNP       Future Appointments             In 4 months Jacky Kindle, FNP [facility]            Passed - Patient is not pregnant       sertraline (ZOLOFT) 100 MG tablet 180 tablet 1    Sig: Take 2 tablets (200 mg total) by mouth daily.     Psychiatry:  Antidepressants - SSRI - sertraline Failed - 10/24/2023  1:27 PM      Failed - Valid encounter within last 6 months    Recent Outpatient Visits           7 months ago Annual physical exam   [facility] [facility] Merita Norton T, FNP   1 year ago Major depression, recurrent, chronic [facility])   [place] [facility] Merita Norton T, FNP   2 years ago Other insomnia   [place] Merita Norton T, FNP   2 years ago Major depression, recurrent, chronic [facility])   [place] [facility] Jacky Kindle, FNP   2 years ago Anxiety   [place] Just, Azalee Course, FNP       Future Appointments             In 4 months Jacky Kindle, FNP  [facility]            Passed - AST in normal range and within 360 days    AST  Date Value Ref Range Status  03/18/2023 11 0 - 40 IU/L Final   SGOT(AST)  Date Value Ref Range Status  09/01/2013 19 15 - 37 Unit/L Final         Passed - ALT in normal range and within 360 days    ALT  Date Value Ref Range Status  03/18/2023 9 0 - 32 IU/L Final  SGPT (ALT)  Date Value Ref Range Status  09/01/2013 21 12 - 78 U/L Final         Passed - Completed PHQ-2 or PHQ-9 in the last 360 days

## 2023-10-28 ENCOUNTER — Other Ambulatory Visit: Payer: Self-pay

## 2023-10-28 DIAGNOSIS — F419 Anxiety disorder, unspecified: Secondary | ICD-10-CM

## 2023-10-28 MED ORDER — SERTRALINE HCL 100 MG PO TABS
200.0000 mg | ORAL_TABLET | Freq: Every day | ORAL | 1 refills | Status: DC
Start: 2023-10-28 — End: 2024-04-16

## 2023-10-28 NOTE — Telephone Encounter (Signed)
Requested medication (s) are due for refill today: Yes  Requested medication (s) are on the active medication list: Yes  Last refill:  09/23/23 #30, 0RF  Future visit scheduled: Yes 11/11/23  Notes to clinic:  Unable to refill per protocol, cannot delegate. Unable to refill per protocol, courtesy refill already given, routing for provider approval.      Requested Prescriptions  Pending Prescriptions Disp Refills   ALPRAZolam (XANAX) 0.25 MG tablet 30 tablet 0    Sig: Take 1 tablet (0.25 mg total) by mouth daily as needed for anxiety. Courtesy refill; due for follow up appt.     Not Delegated - Psychiatry: Anxiolytics/Hypnotics 2 Failed - 10/28/2023 11:11 AM      Failed - This refill cannot be delegated      Failed - Urine Drug Screen completed in last 360 days      Failed - Valid encounter within last 6 months    Recent Outpatient Visits           7 months ago Annual physical exam   [facility] Mer[facility] Norton T, FNP   1 year ago Major depression, recurrent, chronic [facility])   [place] Merita Norton T, FNP   2 years ago Other insomnia   [place] [facility] Merita Norton T, FNP   2 years ago Major depression, recurrent, chronic [facility])   [place] [facility] Jacky Kindle, FNP   2 years ago Anxiety    [facility] Just, Azalee Course, FNP       Future Appointments             In 2 weeks Jacky Kindle, FNP [facility]   In 4 months Jacky Kindle, FNP [facility], [facility]            Passed - Patient is not pregnant

## 2023-10-28 NOTE — Telephone Encounter (Signed)
Patient has been scheduled for an appointment and requested a short term supply of medication be sent to the pharmacy until she can come in for her 11/11/23 appointment.

## 2023-10-29 MED ORDER — ALPRAZOLAM 0.25 MG PO TABS
0.2500 mg | ORAL_TABLET | Freq: Every day | ORAL | 0 refills | Status: DC | PRN
Start: 2023-10-29 — End: 2023-11-11

## 2023-11-05 ENCOUNTER — Encounter: Payer: Self-pay | Admitting: Psychiatry

## 2023-11-05 ENCOUNTER — Ambulatory Visit (INDEPENDENT_AMBULATORY_CARE_PROVIDER_SITE_OTHER): Payer: 59 | Admitting: Psychiatry

## 2023-11-05 VITALS — BP 128/91 | HR 99 | Temp 98.1°F | Ht 61.0 in | Wt 109.6 lb

## 2023-11-05 DIAGNOSIS — Z79899 Other long term (current) drug therapy: Secondary | ICD-10-CM | POA: Diagnosis not present

## 2023-11-05 DIAGNOSIS — F411 Generalized anxiety disorder: Secondary | ICD-10-CM | POA: Insufficient documentation

## 2023-11-05 DIAGNOSIS — F331 Major depressive disorder, recurrent, moderate: Secondary | ICD-10-CM

## 2023-11-05 DIAGNOSIS — F4381 Prolonged grief disorder: Secondary | ICD-10-CM | POA: Diagnosis not present

## 2023-11-05 MED ORDER — TRAZODONE HCL 50 MG PO TABS
25.0000 mg | ORAL_TABLET | Freq: Every evening | ORAL | 1 refills | Status: DC | PRN
Start: 2023-11-05 — End: 2024-03-15

## 2023-11-05 MED ORDER — HYDROXYZINE HCL 25 MG PO TABS: 12.5000 mg | ORAL_TABLET | Freq: Three times a day (TID) | ORAL | 1 refills | Status: AC | PRN

## 2023-11-05 MED ORDER — LAMOTRIGINE 25 MG PO TABS
ORAL_TABLET | ORAL | 0 refills | Status: DC
Start: 2023-11-05 — End: 2023-11-11

## 2023-11-05 NOTE — Patient Instructions (Signed)
[email or web address]  [email or web address]  [email or web address] [facility] [address], [place], [place] [address]   [phone]  [facility] [email or web address] [address] [place], [place] [address]  [phone]  [facility] [email or web address] [address] [place], [place] [address]  [phone]   Family solutions - [phone]  [facility] - [phone]  [facility] - [phone] counseling [phone]  [facility] [phone]   [email or web address] this clinician can offer telehealth and has a sliding scale option  [email or web address] this group also offers sliding scale rates and is based out of [place]  Dr. Liborio Nixon with the [facility] specializes in divorce  [employer] [facility] has interns who offer sliding scale rates and some of the full time clinicians do, as well. You complete their contact form on their website and the referrals coordinator will help to get connected to someone   Medicaid below :  [facility] [address] [place], [place] [address]  [phone]    [employer] [address] [place], [place] [address]  [phone]    [facility] [address] [place], [place] [address]  [phone]      Hydroxyzine Capsules or Tablets What is this medication? HYDROXYZINE (hye DROX i zeen) treats the symptoms of allergies and allergic reactions. It may also be used to treat anxiety or cause drowsiness before a procedure. It works by blocking histamine, a substance released by the body during an allergic reaction. It belongs to a group of medications called antihistamines. This medicine may be  used for other purposes; ask your health care provider or pharmacist if you have questions. COMMON BRAND NAME(S): ANX, Atarax, Rezine, Vistaril What should I tell my care team before I take this medication? They need to know if you have any of these conditions: Glaucoma Heart disease Irregular heartbeat or rhythm Kidney disease Liver disease Lung or breathing disease, such as asthma Stomach or intestine problems Thyroid disease Trouble passing urine An unusual or allergic reaction to hydroxyzine, other medications, foods, dyes or preservatives Pregnant or trying to get pregnant Breastfeeding How should I use this medication? Take this medication by mouth with a full glass of water. Take it as directed on the prescription label at the same time every day. You can take it with or without food. If it upsets your stomach, take it with food. Talk to your care team about the use of this medication in children. While it may be prescribed for children as young as 6 years for selected conditions, precautions do apply. People 65 years and older may have a stronger reaction and need a smaller dose. Overdosage: If you think you have taken too much of this medicine contact a poison control center or emergency room at once. NOTE: This medicine is only for you. Do not share this medicine with others. What if I miss a dose? If you miss a dose, take it as soon as you can. If it is almost time for your next dose, take only that dose. Do not take double or extra doses. What may interact with this medication? Do not take this medication with any of the following: Cisapride Dronedarone Pimozide Thioridazine This medication may also interact with the following: Alcohol Antihistamines for allergy, cough, and cold Atropine Barbiturate medications for sleep  or seizures, such as phenobarbital Certain antibiotics, such as erythromycin or clarithromycin Certain medications for anxiety or sleep Certain  medications for bladder problems, such as oxybutynin or tolterodine Certain medications for irregular heartbeat Certain medications for mental health conditions Certain medications for Parkinson disease, such as benztropine, trihexyphenidyl Certain medications for seizures, such as phenobarbital or primidone Certain medications for stomach problems, such as dicyclomine or hyoscyamine Certain medications for travel sickness, such as scopolamine Ipratropium Opioid medications for pain Other medications that cause heart rhythm changes, such as dofetilide This list may not describe all possible interactions. Give your health care provider a list of all the medicines, herbs, non-prescription drugs, or dietary supplements you use. Also tell them if you smoke, drink alcohol, or use illegal drugs. Some items may interact with your medicine. What should I watch for while using this medication? Visit your care team for regular checks on your progress. Tell your care team if your symptoms do not start to get better or if they get worse. This medication may affect your coordination, reaction time, or judgment. Do not drive or operate machinery until you know how this medication affects you. Sit up or stand slowly to reduce the risk of dizzy or fainting spells. Drinking alcohol with this medication can increase the risk of these side effects. Your mouth may get dry. Chewing sugarless gum or sucking hard candy and drinking plenty of water may help. Contact your care team if the problem does not go away or is severe. This medication may cause dry eyes and blurred vision. If you wear contact lenses, you may feel some discomfort. Lubricating eye drops may help. See your care team if the problem does not go away or is severe. If you are receiving skin tests for allergies, tell your care team you are taking this medication. What side effects may I notice from receiving this medication? Side effects that you should  report to your care team as soon as possible: Allergic reactions--skin rash, itching, hives, swelling of the face, lips, tongue, or throat Heart rhythm changes--fast or irregular heartbeat, dizziness, feeling faint or lightheaded, chest pain, trouble breathing Side effects that usually do not require medical attention (report to your care team if they continue or are bothersome): Confusion Drowsiness Dry mouth Hallucinations Headache This list may not describe all possible side effects. Call your doctor for medical advice about side effects. You may report side effects to FDA at 1-800-FDA-1088. Where should I keep my medication? Keep out of the reach of children and pets. Store at room temperature between 15 and 30 degrees C (59 and 86 degrees F). Keep container tightly closed. Throw away any unused medication after the expiration date. NOTE: This sheet is a summary. It may not cover all possible information. If you have questions about this medicine, talk to your doctor, pharmacist, or health care provider.  2024 Elsevier/Gold Standard (2022-07-05 00:00:00) Lamotrigine Tablets What is this medication? LAMOTRIGINE (la MOE Patrecia Pace) prevents and controls seizures in people with epilepsy. It may also be used to treat bipolar disorder. It works by calming overactive nerves in your body. This medicine may be used for other purposes; ask your health care provider or pharmacist if you have questions. COMMON BRAND NAME(S): Lamictal, Subvenite What should I tell my care team before I take this medication? They need to know if you have any of these conditions: Heart disease History of irregular heartbeat Immune system problems Kidney disease Liver disease Low levels of folic acid in  the blood Lupus Mental health condition Suicidal thoughts, plans, or attempt by you or a family member An unusual or allergic reaction to lamotrigine, other medications, foods, dyes, or preservatives Pregnant or  trying to get pregnant Breastfeeding How should I use this medication? Take this medication by mouth with a glass of water. Follow the directions on the prescription label. Do not chew these tablets. If this medication upsets your stomach, take it with food or milk. Take your doses at regular intervals. Do not take your medication more often than directed. A special MedGuide will be given to you by the pharmacist with each new prescription and refill. Be sure to read this information carefully each time. Talk to your care team about the use of this medication in children. While this medication may be prescribed for children as young as 2 years for selected conditions, precautions do apply. Overdosage: If you think you have taken too much of this medicine contact a poison control center or emergency room at once. NOTE: This medicine is only for you. Do not share this medicine with others. What if I miss a dose? If you miss a dose, take it as soon as you can. If it is almost time for your next dose, take only that dose. Do not take double or extra doses. What may interact with this medication? Atazanavir Certain medications for irregular heartbeat Certain medications for seizures, such as carbamazepine, phenobarbital, phenytoin, primidone, or valproic acid Estrogen or progestin hormones Lopinavir Rifampin Ritonavir This list may not describe all possible interactions. Give your health care provider a list of all the medicines, herbs, non-prescription drugs, or dietary supplements you use. Also tell them if you smoke, drink alcohol, or use illegal drugs. Some items may interact with your medicine. What should I watch for while using this medication? Visit your care team for regular checks on your progress. If you take this medication for seizures, wear a Medic Alert bracelet or necklace. Carry an identification card with information about your condition, medications, and care team. It is important  to take this medication exactly as directed. When first starting treatment, your dose will need to be adjusted slowly. It may take weeks or months before your dose is stable. You should contact your care team if your seizures get worse or if you have any new types of seizures. Do not stop taking this medication unless instructed by your care team. Stopping your medication suddenly can increase your seizures or their severity. This medication may cause serious skin reactions. They can happen weeks to months after starting the medication. Contact your care team right away if you notice fevers or flu-like symptoms with a rash. The rash may be red or purple and then turn into blisters or peeling of the skin. You may also notice a red rash with swelling of the face, lips, or lymph nodes in your neck or under your arms. This medication may affect your coordination, reaction time, or judgment. Do not drive or operate machinery until you know how this medication affects you. Sit up or stand slowly to reduce the risk of dizzy or fainting spells. Drinking alcohol with this medication can increase the risk of these side effects. If you are taking this medication for bipolar disorder, it is important to report any changes in your mood to your care team. If your condition gets worse, you get mentally depressed, feel very hyperactive or manic, have difficulty sleeping, or have thoughts of hurting yourself or committing suicide, you  need to get help from your care team right away. If you are a caregiver for someone taking this medication for bipolar disorder, you should also report these behavioral changes right away. The use of this medication may increase the chance of suicidal thoughts or actions. Pay special attention to how you are responding while on this medication. Your mouth may get dry. Chewing sugarless gum or sucking hard candy and drinking plenty of water may help. Contact your care team if the problem does not go  away or is severe. If you become pregnant while using this medication, you may enroll in the [place] American Antiepileptic Drug Pregnancy Registry by calling [phone]. This registry collects information about the safety of antiepileptic medication use during pregnancy. This medication may cause a decrease in folic acid. You should make sure that you get enough folic acid while you are taking this medication. Discuss the foods you eat and the vitamins you take with your care team. What side effects may I notice from receiving this medication? Side effects that you should report to your care team as soon as possible: Allergic reactions--skin rash, itching, hives, swelling of the face, lips, tongue, or throat Change in vision Fever, neck pain or stiffness, sensitivity to light, headache, nausea, vomiting, confusion Heart rhythm changes--fast or irregular heartbeat, dizziness, feeling faint or lightheaded, chest pain, trouble breathing Infection--fever, chills, cough, or sore throat Liver injury--right upper belly pain, loss of appetite, nausea, light-colored stool, dark yellow or brown urine, yellowing skin or eyes, unusual weakness or fatigue Low red blood cell count--unusual weakness or fatigue, dizziness, headache, trouble breathing Rash, fever, and swollen lymph nodes Redness, blistering, peeling or loosening of the skin, including inside the mouth Thoughts of suicide or self-harm, worsening mood, or feelings of depression Unusual bruising or bleeding Side effects that usually do not require medical attention (report to your care team if they continue or are bothersome): Diarrhea Dizziness Drowsiness Headache Nausea Stomach pain Tremors or shaking This list may not describe all possible side effects. Call your doctor for medical advice about side effects. You may report side effects to FDA at 1-800-FDA-1088. Where should I keep my medication? Keep out of the reach of children and  pets. Store at [employer] C (77 degrees F). Protect from light. Get rid of any unused medication after the expiration date. To get rid of medications that are no longer needed or have expired: Take the medication to a medication take-back program. Check with your pharmacy or law enforcement to find a location. If you cannot return the medication, check the label or package insert to see if the medication should be thrown out in the garbage or flushed down the toilet. If you are not sure, ask your care team. If it is safe to put it in the trash, empty the medication out of the container. Mix the medication with cat litter, dirt, coffee grounds, or other unwanted substance. Seal the mixture in a bag or container. Put it in the trash. NOTE: This sheet is a summary. It may not cover all possible information. If you have questions about this medicine, talk to your doctor, pharmacist, or health care provider.  2024 Elsevier/Gold Standard (2022-06-04 00:00:00)
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
## 2023-11-11 ENCOUNTER — Ambulatory Visit (INDEPENDENT_AMBULATORY_CARE_PROVIDER_SITE_OTHER): Payer: Managed Care, Other (non HMO) | Admitting: Family Medicine

## 2023-11-11 VITALS — BP 115/77 | HR 86 | Ht 61.0 in | Wt 110.0 lb

## 2023-11-11 DIAGNOSIS — Z789 Other specified health status: Secondary | ICD-10-CM | POA: Diagnosis not present

## 2023-11-11 DIAGNOSIS — F32A Depression, unspecified: Secondary | ICD-10-CM

## 2023-11-11 DIAGNOSIS — F331 Major depressive disorder, recurrent, moderate: Secondary | ICD-10-CM | POA: Diagnosis not present

## 2023-11-11 DIAGNOSIS — E559 Vitamin D deficiency, unspecified: Secondary | ICD-10-CM | POA: Diagnosis not present

## 2023-11-11 DIAGNOSIS — F419 Anxiety disorder, unspecified: Secondary | ICD-10-CM | POA: Diagnosis not present

## 2023-11-11 MED ORDER — ALPRAZOLAM 0.25 MG PO TABS: 0.2500 mg | ORAL_TABLET | Freq: Every day | ORAL | 0 refills | Status: DC | PRN

## 2023-11-11 MED ORDER — LAMOTRIGINE 25 MG PO TABS: 25.0000 mg | ORAL_TABLET | Freq: Two times a day (BID) | ORAL | 0 refills | Status: DC

## 2023-11-11 NOTE — Assessment & Plan Note (Signed)
Chronic, improved from GAD scoring Continue low dose xanax with adjustment to hydroxyzine Continue 6 month appts

## 2023-11-11 NOTE — Assessment & Plan Note (Signed)
Chronic, improved Continues on daily Lamictal 25 mg with plan to titrate to BID dosing

## 2023-11-11 NOTE — Progress Notes (Signed)
Established patient visit   Patient: Annette Bowers   DOB: 18-Jun-1986   37 y.o. Female  MRN: [id number] Visit Date: 11/11/2023  Today's healthcare provider: Jacky Kindle, FNP  Re Introduced to nurse practitioner role and practice setting.  All questions answered.  Discussed provider/patient relationship and expectations.  Subjective    HPI   Has established with [facility]- has started to use PRN hydroxyzine to assist daytime anxiety. Has also started lamictal at 25 mg daily with plan to increase after 2 weeks to BID dosing. Remains hesitant about use of trazodone given concern for ability to make it to work on time.  Has not established an appt with counselor at this time   Medications: Outpatient Medications Prior to Visit  Medication Sig   hydrOXYzine (ATARAX) 25 MG tablet Take 0.5-1 tablets (12.5-25 mg total) by mouth 3 (three) times daily as needed for anxiety.   sertraline (ZOLOFT) 100 MG tablet Take 2 tablets (200 mg total) by mouth daily.   traZODone (DESYREL) 50 MG tablet Take 0.5-2 tablets (25-100 mg total) by mouth at bedtime as needed for sleep.   Vitamin D, Ergocalciferol, (DRISDOL) 1.25 MG (50000 UNIT) CAPS capsule Take 1 capsule (50,000 Units total) by mouth every 7 (seven) days.   [DISCONTINUED] ALPRAZolam (XANAX) 0.25 MG tablet Take 1 tablet (0.25 mg total) by mouth daily as needed for anxiety. Courtesy refill; due for follow up appt.   [DISCONTINUED] lamoTRIgine (LAMICTAL) 25 MG tablet Take 1 tablet (25 mg total) by mouth daily for 15 days, THEN 1 tablet (25 mg total) 2 (two) times daily for 15 days.   No facility-administered medications prior to visit.     Objective    BP 115/77 (BP Location: Right Arm, Patient Position: Sitting, Cuff Size: Normal)   Pulse 86   Ht 5\' 1"  (1.549 m)   Wt 110 lb (49.9 kg)   LMP 11/03/2023   SpO2 99%   BMI 20.78 kg/m   Physical Exam Vitals and nursing note reviewed.  Constitutional:      General: She is not in acute  distress.    Appearance: Normal appearance. She is normal weight. She is not ill-appearing, toxic-appearing or diaphoretic.  HENT:     Head: Normocephalic and atraumatic.  Cardiovascular:     Rate and Rhythm: Normal rate and regular rhythm.     Pulses: Normal pulses.     Heart sounds: Normal heart sounds. No murmur heard.    No friction rub. No gallop.  Pulmonary:     Effort: Pulmonary effort is normal. No respiratory distress.     Breath sounds: Normal breath sounds. No stridor. No wheezing, rhonchi or rales.  Chest:     Chest wall: No tenderness.  Musculoskeletal:        General: No swelling, tenderness, deformity or signs of injury. Normal range of motion.     Right lower leg: No edema.     Left lower leg: No edema.  Skin:    General: Skin is warm and dry.     Capillary Refill: Capillary refill takes less than 2 seconds.     Coloration: Skin is not jaundiced or pale.     Findings: No bruising, erythema, lesion or rash.  Neurological:     General: No focal deficit present.     Mental Status: She is alert and oriented to person, place, and time. Mental status is at baseline.     Cranial Nerves: No cranial nerve deficit.  Sensory: No sensory deficit.     Motor: No weakness.     Coordination: Coordination normal.  Psychiatric:        Mood and Affect: Mood is anxious. Affect is flat.        Behavior: Behavior normal.        Thought Content: Thought content normal.        Judgment: Judgment normal.     No results found for any visits on 11/11/23.  Assessment & Plan     Problem List Items Addressed This Visit       Other   Anxiety    Chronic, improved from GAD scoring Continue low dose xanax with adjustment to hydroxyzine Continue 6 month appts       Relevant Medications   ALPRAZolam (XANAX) 0.25 MG tablet   Avitaminosis D - Primary    Chronic, previously treated Repeat labs       Fatigue due to depression    Acute on chronic, ongoing Has not met with  counselor; plans to address shortly Repeat labs to assist possible chronic conditions outside of mental health needs Last CPE 4/24 Referral to GYN to assist with TTC      Relevant Orders   CBC with Differential/Platelet   Comprehensive Metabolic Panel (CMET)   TSH   Hemoglobin A1c   Vitamin D (25 hydroxy)   Lipid panel   Moderate episode of recurrent major depressive disorder (HCC)    Chronic, improved Continues on daily Lamictal 25 mg with plan to titrate to BID dosing       Relevant Medications   ALPRAZolam (XANAX) 0.25 MG tablet   lamoTRIgine (LAMICTAL) 25 MG tablet   Other Visit Diagnoses     Attempting to conceive       Relevant Orders   Ambulatory referral to Obstetrics / Gynecology      Return in about 4 months (around 03/11/2024) for annual examination.     Leilani Merl, FNP, have reviewed all documentation for this visit. The documentation on 11/11/23 for the exam, diagnosis, procedures, and orders are all accurate and complete.  Jacky Kindle, FNP  [facility] [phone] (phone) [phone] (fax)  [facility]

## 2023-11-11 NOTE — Assessment & Plan Note (Signed)
Acute on chronic, ongoing Has not met with counselor; plans to address shortly Repeat labs to assist possible chronic conditions outside of mental health needs Last CPE 4/24 Referral to GYN to assist with TTC

## 2023-11-11 NOTE — Assessment & Plan Note (Signed)
Chronic, previously treated Repeat labs

## 2023-11-12 LAB — CBC WITH DIFFERENTIAL/PLATELET
Basophils Absolute: 0.1 10*3/uL (ref 0.0–0.2)
Basos: 1 %
EOS (ABSOLUTE): 0.6 10*3/uL — ABNORMAL HIGH (ref 0.0–0.4)
Eos: 7 %
Hematocrit: 40.7 % (ref 34.0–46.6)
Hemoglobin: 13.4 g/dL (ref 11.1–15.9)
Immature Grans (Abs): 0 10*3/uL (ref 0.0–0.1)
Immature Granulocytes: 0 %
Lymphocytes Absolute: 3.2 10*3/uL — ABNORMAL HIGH (ref 0.7–3.1)
Lymphs: 37 %
MCH: 31.5 pg (ref 26.6–33.0)
MCHC: 32.9 g/dL (ref 31.5–35.7)
MCV: 96 fL (ref 79–97)
Monocytes Absolute: 0.6 10*3/uL (ref 0.1–0.9)
Monocytes: 7 %
Neutrophils Absolute: 4.2 10*3/uL (ref 1.4–7.0)
Neutrophils: 48 %
Platelets: 270 10*3/uL (ref 150–450)
RBC: 4.25 x10E6/uL (ref 3.77–5.28)
RDW: 12.1 % (ref 11.7–15.4)
WBC: 8.7 10*3/uL (ref 3.4–10.8)

## 2023-11-12 LAB — LIPID PANEL
Chol/HDL Ratio: 3.2 {ratio} (ref 0.0–4.4)
Cholesterol, Total: 206 mg/dL — ABNORMAL HIGH (ref 100–199)
HDL: 64 mg/dL (ref >39)
LDL Chol Calc (NIH): 117 mg/dL — ABNORMAL HIGH (ref 0–99)
Triglycerides: 142 mg/dL (ref 0–149)
VLDL Cholesterol Cal: 25 mg/dL (ref 5–40)

## 2023-11-12 LAB — COMPREHENSIVE METABOLIC PANEL
ALT: 13 [IU]/L (ref 0–32)
AST: 16 [IU]/L (ref 0–40)
Albumin: 5 g/dL — ABNORMAL HIGH (ref 3.9–4.9)
Alkaline Phosphatase: 49 [IU]/L (ref 44–121)
BUN/Creatinine Ratio: 11 (ref 9–23)
BUN: 9 mg/dL (ref 6–20)
Bilirubin Total: 0.3 mg/dL (ref 0.0–1.2)
CO2: 27 mmol/L (ref 20–29)
Calcium: 9.8 mg/dL (ref 8.7–10.2)
Chloride: 96 mmol/L (ref 96–106)
Creatinine, Ser: 0.79 mg/dL (ref 0.57–1.00)
Globulin, Total: 2.7 g/dL (ref 1.5–4.5)
Glucose: 92 mg/dL (ref 70–99)
Potassium: 3.9 mmol/L (ref 3.5–5.2)
Sodium: 139 mmol/L (ref 134–144)
Total Protein: 7.7 g/dL (ref 6.0–8.5)
eGFR: 100 mL/min/{1.73_m2} (ref >59)

## 2023-11-12 LAB — TSH: TSH: 1.58 u[IU]/mL (ref 0.450–4.500)

## 2023-11-12 LAB — VITAMIN D 25 HYDROXY (VIT D DEFICIENCY, FRACTURES): Vit D, 25-Hydroxy: 25.3 ng/mL — ABNORMAL LOW (ref 30.0–100.0)

## 2023-11-12 LAB — HEMOGLOBIN A1C
Est. average glucose Bld gHb Est-mCnc: 105 mg/dL
Hgb A1c MFr Bld: 5.3 % (ref 4.8–5.6)

## 2023-11-14 ENCOUNTER — Other Ambulatory Visit: Payer: Self-pay | Admitting: Family Medicine

## 2023-11-14 DIAGNOSIS — E559 Vitamin D deficiency, unspecified: Secondary | ICD-10-CM

## 2023-11-14 MED ORDER — VITAMIN D (ERGOCALCIFEROL) 1.25 MG (50000 UNIT) PO CAPS: 50000.0000 [IU] | ORAL_CAPSULE | ORAL | 1 refills | Status: AC

## 2023-11-28 ENCOUNTER — Encounter: Payer: Self-pay | Admitting: Obstetrics and Gynecology

## 2023-12-06 ENCOUNTER — Other Ambulatory Visit: Payer: Self-pay | Admitting: Psychiatry

## 2023-12-06 DIAGNOSIS — F331 Major depressive disorder, recurrent, moderate: Secondary | ICD-10-CM

## 2023-12-30 ENCOUNTER — Other Ambulatory Visit: Payer: Self-pay | Admitting: Family Medicine

## 2023-12-30 DIAGNOSIS — F419 Anxiety disorder, unspecified: Secondary | ICD-10-CM

## 2023-12-30 NOTE — Telephone Encounter (Signed)
Medication Refill -  Most Recent Primary Care Visit:  Provider: Merita Norton T  Department: [facility]  Visit Type: OFFICE VISIT  Date: 11/11/2023  Medication:  ALPRAZolam (XANAX) 0.25 MG tablet   Has the patient contacted their pharmacy? Yes (Previous Robynn Pane pt, and she thought it would go to the new dr  Is this the correct pharmacy for this prescription? Yes If no, delete pharmacy and type the correct one.  This is the patient's preferred pharmacy:  [id number] - [place], [place] - [address] AT [facility] [address] [place] [address] [place] [address] Phone: [phone] Fax: [phone]   Has the prescription been filled recently? No  Is the patient out of the medication? Yes  Has the patient been seen for an appointment in the last year OR does the patient have an upcoming appointment? Yes  Can we respond through MyChart? Yes  Agent: Please be advised that Rx refills may take up to 3 business days. We ask that you follow-up with your pharmacy.

## 2023-12-30 NOTE — Telephone Encounter (Signed)
Requested medication (s) are due for refill today: yes  Requested medication (s) are on the active medication list: yes  Last refill:  11/11/23  Future visit scheduled: yes  Notes to clinic:  Unable to refill per protocol, cannot delegate.      Requested Prescriptions  Pending Prescriptions Disp Refills   ALPRAZolam (XANAX) 0.25 MG tablet 30 tablet 0    Sig: Take 1 tablet (0.25 mg total) by mouth daily as needed for anxiety. Continue atarax to assist with benzo wean per psych     Not Delegated - Psychiatry: Anxiolytics/Hypnotics 2 Failed - 12/30/2023  4:08 PM      Failed - This refill cannot be delegated      Failed - Urine Drug Screen completed in last 360 days      Passed - Patient is not pregnant      Passed - Valid encounter within last 6 months    Recent Outpatient Visits           1 month ago Avitaminosis D   [facility] Jacky Kindle, FNP   9 months ago Annual physical exam   [facility] Merita Norton T, FNP   1 year ago Major depression, recurrent, chronic [facility])   [place] [facility] Merita Norton T, FNP   2 years ago Other insomnia   [place] [facility] Merita Norton T, FNP   2 years ago Major depression, recurrent, chronic [facility])    [facility] Jacky Kindle, FNP       Future Appointments             In 2 months Simmons-Robinson, Tawanna Cooler, MD [facility]

## 2023-12-31 ENCOUNTER — Encounter: Payer: Self-pay | Admitting: Psychiatry

## 2023-12-31 ENCOUNTER — Telehealth (INDEPENDENT_AMBULATORY_CARE_PROVIDER_SITE_OTHER): Payer: 59 | Admitting: Psychiatry

## 2023-12-31 DIAGNOSIS — F331 Major depressive disorder, recurrent, moderate: Secondary | ICD-10-CM

## 2023-12-31 DIAGNOSIS — F4381 Prolonged grief disorder: Secondary | ICD-10-CM | POA: Diagnosis not present

## 2023-12-31 DIAGNOSIS — F411 Generalized anxiety disorder: Secondary | ICD-10-CM

## 2023-12-31 MED ORDER — LAMOTRIGINE 25 MG PO TABS: 75.0000 mg | ORAL_TABLET | Freq: Every day | ORAL | 0 refills | Status: DC

## 2023-12-31 MED ORDER — LAMOTRIGINE 100 MG PO TABS: 100.0000 mg | ORAL_TABLET | Freq: Every day | ORAL | 1 refills | Status: DC

## 2023-12-31 MED ORDER — ALPRAZOLAM 0.25 MG PO TABS: 0.2500 mg | ORAL_TABLET | Freq: Every day | ORAL | 0 refills | Status: DC | PRN

## 2023-12-31 NOTE — Progress Notes (Unsigned)
Virtual Visit via Video Note  I connected with Annette Bowers on 12/31/23 at  4:30 PM EST by a video enabled telemedicine application and verified that I am speaking with the correct person using two identifiers.  Location Provider Location : [facility] Patient Location : Home  Participants: Patient , Provider   I discussed the limitations of evaluation and management by telemedicine and the availability of in person appointments. The patient expressed understanding and agreed to proceed.   I discussed the assessment and treatment plan with the patient. The patient was provided an opportunity to ask questions and all were answered. The patient agreed with the plan and demonstrated an understanding of the instructions.   The patient was advised to call back or seek an in-person evaluation if the symptoms worsen or if the condition fails to improve as anticipated.   BH MD OP Progress Note  01/01/2024 7:57 PM Annette Bowers  MRN:  [id number]  Chief Complaint:  Chief Complaint  Patient presents with   Follow-up   Medication Refill   Anxiety   Depression   HPI: Annette Bowers is a 38 year old Caucasian female, currently employed, engaged, lives in [place], has a history of MDD, GAD, persistent complex bereavement disorder, migraine headaches was evaluated by telemedicine today.  The patient, with a history of major depression, generalized anxiety, and ongoing grief, reports a mixed response to the current medication regimen. She has been compliant with the prescribed medications, including Lamictal 25mg  twice daily, hydroxyzine 25mg  up to two to three times daily, and trazodone 50-100mg  as needed, with the exception of not taking trazodone every night due to concerns about feeling groggy at work.  The patient reports no noticeable side effects from the Lamictal and has not observed any significant changes in her mood or depressive symptoms since starting the medication. She experienced  a brief interruption in the medication due to an issue with her pharmacy but has otherwise been consistent with the medication.  The patient's sleep is reportedly better on the nights when trazodone is taken, but sleep remains disrupted on the nights without the medication. Despite this, the patient has been hesitant to take trazodone on work nights due to concerns about morning grogginess.  The patient has not yet started therapy, which was recommended during the previous visit. She initially delayed seeking therapy due to the holiday season and then encountered difficulty finding a local provider accepting new patients.  The patient's depressive symptoms persist, including a lack of motivation, particularly in relation to social activities and self-care. 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 The patient has noticed some improvement since starting sertraline twice daily, but the lack of motivation for social activities remains a significant concern.  The patient denies any thoughts of self-harm or harm to others. She expresses a desire to understand the expected timeline for observing effects from the Lamictal and is open to adjusting the dosage if needed.  Visit Diagnosis:    ICD-10-CM   1. Moderate episode of recurrent major depressive disorder (HCC)  F33.1 lamoTRIgine (LAMICTAL) 25 MG tablet    lamoTRIgine (LAMICTAL) 100 MG tablet    2. GAD (generalized anxiety disorder)  F41.1     3. 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  Past Medical History:  Past Medical History:  Diagnosis Date  Allergy    Anemia    Anxiety    Encounter for screening for HIV 09/19/2022   Insomnia    Migraine    Moderate major depression (HCC)     Past Surgical History:  Procedure Laterality Date   WISDOM TOOTH  EXTRACTION  2011    Family Psychiatric History: I have reviewed family psychiatric history from progress note on 11/05/2023.  Family History:  Family History  Problem Relation Age of Onset   Hyperlipidemia Mother    Hypertension Mother    Diabetes Father    Hypertension Father    Hypertension Maternal Uncle    Diabetes Maternal Grandfather    Diabetes Maternal Grandmother    Heart disease Maternal Grandmother    Hypertension Maternal Grandmother    Arthritis Maternal Grandmother    Kidney failure Maternal Grandmother    Lung cancer Paternal Grandmother    Cancer Paternal Grandmother    Multiple sclerosis Cousin    Mental illness Neg Hx     Social History: I have reviewed social history from progress note on 11/05/2023. Social History   Socioeconomic History   Marital status: Significant Other    Spouse name: Not on file   Number of children: 1   Years of education: Not on file   Highest education level: Associate degree: occupational, Scientist, product/process development, or vocational program  Occupational History    Employer: [employer]  Tobacco Use   Smoking status: Former    Current packs/day: 0.50    Average packs/day: 0.5 packs/day for 8.0 years (4.0 ttl pk-yrs)    Types: Cigarettes   Smokeless tobacco: Current   Tobacco comments:    Vaping daily  Vaping Use   Vaping status: Every Day  Substance and Sexual Activity   Alcohol use: Yes    Alcohol/week: 3.0 - 4.0 standard drinks of alcohol    Types: 3 - 4 Glasses of wine per week   Drug use: No   Sexual activity: Yes    Birth control/protection: None  Other Topics Concern   Not on file  Social History Narrative   Not on file   Social Drivers of Health   Financial Resource Strain: High Risk (11/11/2023)   Overall Financial Resource Strain (CARDIA)    Difficulty of Paying Living Expenses: Hard  Food Insecurity: Food Insecurity Present (11/11/2023)   Hunger Vital Sign    Worried About Running Out of Food in the Last Year: Sometimes  true    Ran Out of Food in the Last Year: Sometimes true  Transportation Needs: Unmet Transportation Needs (11/11/2023)   PRAPARE - Transportation    Lack of Transportation (Medical): No    Lack of Transportation (Non-Medical): Yes  Physical Activity: Sufficiently Active (11/11/2023)   Exercise Vital Sign    Days of Exercise per Week: 5 days    Minutes of Exercise per Session: 30 min  Stress: Stress Concern Present (11/11/2023)   [employer] of Occupational Health - Occupational Stress Questionnaire    Feeling of Stress : Very much  Social Connections: Moderately Isolated (11/11/2023)   Social Connection and Isolation Panel [NHANES]    Frequency of Communication with Friends and Family: More than three times a week    Frequency of Social Gatherings with Friends and Family: Once a week    Attends Religious Services: 1 to 4 times per year    Active Member of [employer] or Organizations: No    Attends Engineer, structural: Not on file    Marital Status: Divorced  Allergies:  Allergies  Allergen Reactions   Promethazine Hcl     drowsy   Topiramate     drowsy    Metabolic Disorder Labs: Lab Results  Component Value Date   HGBA1C 5.3 11/11/2023   No results found for: "PROLACTIN" Lab Results  Component Value Date   CHOL 206 (H) 11/11/2023   TRIG 142 11/11/2023   HDL 64 11/11/2023   CHOLHDL 3.2 11/11/2023   LDLCALC 117 (H) 11/11/2023   LDLCALC 81 03/18/2023   Lab Results  Component Value Date   TSH 1.580 11/11/2023   TSH 0.894 09/19/2022    Therapeutic Level Labs: No results found for: "LITHIUM" No results found for: "VALPROATE" No results found for: "CBMZ"  Current Medications: Current Outpatient Medications  Medication Sig Dispense Refill   [START ON 01/14/2024] lamoTRIgine (LAMICTAL) 100 MG tablet Take 1 tablet (100 mg total) by mouth daily. Start taking after stopping Lamictal 75 mg daily 30 tablet 1   ALPRAZolam (XANAX) 0.25 MG tablet Take 1 tablet  (0.25 mg total) by mouth daily as needed for anxiety. Continue atarax to assist with benzo wean per psych 30 tablet 0   hydrOXYzine (ATARAX) 25 MG tablet Take 0.5-1 tablets (12.5-25 mg total) by mouth 3 (three) times daily as needed for anxiety. 90 tablet 1   lamoTRIgine (LAMICTAL) 25 MG tablet Take 3 tablets (75 mg total) by mouth daily for 15 days. 45 tablet 0   sertraline (ZOLOFT) 100 MG tablet Take 2 tablets (200 mg total) by mouth daily. 180 tablet 1   traZODone (DESYREL) 50 MG tablet Take 0.5-2 tablets (25-100 mg total) by mouth at bedtime as needed for sleep. 60 tablet 1   Vitamin D, Ergocalciferol, (DRISDOL) 1.25 MG (50000 UNIT) CAPS capsule Take 1 capsule (50,000 Units total) by mouth every 7 (seven) days. 26 capsule 1   No current facility-administered medications for this visit.     Musculoskeletal: Strength & Muscle Tone:  UTA Gait & Station:  Seated Patient leans: N/A  Psychiatric Specialty Exam: Review of Systems  Psychiatric/Behavioral:  Positive for dysphoric mood and sleep disturbance. The patient is nervous/anxious.     There were no vitals taken for this visit.There is no height or weight on file to calculate BMI.  General Appearance: Fairly Groomed  Eye Contact:  Fair  Speech:  Normal Rate  Volume:  Normal  Mood:  Anxious and Depressed  Affect:  Congruent  Thought Process:  Goal Directed and Descriptions of Associations: Intact  Orientation:  Full (Time, Place, and Person)  Thought Content: Logical   Suicidal Thoughts:  No  Homicidal Thoughts:  No  Memory:  Immediate;   Fair Recent;   Fair Remote;   Fair  Judgement:  Good  Insight:  Fair  Psychomotor Activity:  Normal  Concentration:  Concentration: Fair and Attention Span: Fair  Recall:  [employer] of Knowledge: Fair  Language: Fair  Akathisia:  No  Handed:  Right  AIMS (if indicated): not done  Assets:  Desire for Improvement Housing Social Support  ADL's:  Intact  Cognition: WNL  Sleep:   poor  when not on medication   Screenings: AUDIT    Flowsheet Row Office Visit from 11/11/2023 in [facility]  Alcohol Use Disorder Identification Test Final Score (AUDIT) 3       GAD-7    Flowsheet Row Office Visit from 11/11/2023 in [facility] Office Visit from 11/05/2023 in [facility] Video Visit from 03/27/2021 in [facility] Video Visit from 01/22/2021 in [facility]  Total GAD-7 Score 16 19 18 18       PHQ2-9    Flowsheet Row Office Visit from 11/11/2023 in [facility] Office Visit from 11/05/2023 in [facility] Telephone from 03/19/2023 in [facility] Office Visit from 03/18/2023 in [facility] Office Visit from 09/19/2022 in [facility]  PHQ-2 Total Score 5 6 4 5 5   PHQ-9 Total Score 14 22 15 17 16       Flowsheet Row Video Visit from 12/31/2023 in [facility] Office Visit from 11/05/2023 in [facility]  C-SSRS RISK CATEGORY No Risk Low Risk        Assessment and Plan: Annette Bowers is a 38 year old Caucasian female, currently employed, engaged, has a history of major depression, GAD, prolonged bereavement, currently with continued mood symptoms, trauma related symptoms, noncompliant with psychotherapy will benefit from medication management and psychotherapy sessions, discussed assessment and plan as noted below.  Major Depression-unstable Annette Bowers presents with major depression characterized by lack of motivation, poor self-care, and social withdrawal. She has been on Lamictal 25 mg twice daily since November without significant improvement. Therapy has not yet been initiated, which is crucial for  optimal treatment outcomes. Discussed the importance of combining therapy with medication. Explained that higher dosages of Lamictal may be necessary for efficacy, with a plan to gradually increase the dosage to 75 mg daily for 15 days, then to 100 mg daily. Emphasized the need for consistent sleep to enhance overall mental health. - Increase Lamictal to 75 mg daily (one tablet in the morning, two in the evening) for 15 days - Increase Lamictal to 100 mg daily after that. 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 Emphasized the importance of consistent sleep for mental health. Suggested taking half a tablet of trazodone earlier in the evening on weekdays to improve sleep quality. - Take half tablet of trazodone (25 mg) earlier in the evening on weekdays - Adjust bedtime to earlier in the evening to improve sleep quality - Refer patient for CBT-communicated with staff. - Continue sertraline 200 mg daily - Hydroxyzine 25 mg as needed takes up to 2-3 times a day only for severe anxiety.  Generalized Anxiety Disorder-unstable Annette Bowers has generalized anxiety disorder, currently managed with hydroxyzine 25 mg up to three times daily as needed. No specific concerns about anxiety were discussed during this visit. - Continue hydroxyzine 25 mg up to three times daily as needed - Continue sertraline as prescribed - Referred for therapy.  TSH reviewed and discussed-dated 11/11/2019 24-1.580-within normal limits.  Follow-up - Follow up in six weeks via video visit on March 14 at 11:40 AM.   Collaboration of Care:  Collaboration of Care: Referral or follow-up with counselor/therapist AEB patient encouraged to establish care with therapist.  Patient/Guardian was advised Release of Information must be obtained prior to any record release in order to collaborate their care with an outside provider. Patient/Guardian was advised if  they have not already done so to contact the registration department to sign all necessary forms in order for [place] to release information regarding their care.   Consent: Patient/Guardian gives verbal consent for treatment and assignment of benefits for services provided during this visit. Patient/Guardian expressed understanding and agreed to proceed.   This note was generated in part or whole with voice recognition software. Voice recognition is usually quite accurate but there are transcription errors that can and very often do occur. I apologize for any typographical errors that were not detected and corrected.    Jomarie Longs, MD 01/01/2024, 7:57 PM

## 2024-01-21 ENCOUNTER — Ambulatory Visit (INDEPENDENT_AMBULATORY_CARE_PROVIDER_SITE_OTHER): Payer: Self-pay | Admitting: Licensed Clinical Social Worker

## 2024-01-21 DIAGNOSIS — F4381 Prolonged grief disorder: Secondary | ICD-10-CM | POA: Diagnosis not present

## 2024-01-21 DIAGNOSIS — F331 Major depressive disorder, recurrent, moderate: Secondary | ICD-10-CM | POA: Diagnosis not present

## 2024-01-21 DIAGNOSIS — F411 Generalized anxiety disorder: Secondary | ICD-10-CM
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
## 2024-01-28 ENCOUNTER — Other Ambulatory Visit: Payer: Self-pay | Admitting: Family Medicine

## 2024-01-28 DIAGNOSIS — F419 Anxiety disorder, unspecified: Secondary | ICD-10-CM

## 2024-01-29 NOTE — Telephone Encounter (Signed)
 Requested medication (s) are due for refill today: yes  Requested medication (s) are on the active medication list: yes  Last refill:  12/31/23  Future visit scheduled: yes  Notes to clinic:  Unable to refill per protocol, cannot delegate.      Requested Prescriptions  Pending Prescriptions Disp Refills   ALPRAZolam (XANAX) 0.25 MG tablet [Pharmacy Med Name: ALPRAZOLAM 0.25MG  TABLETS] 30 tablet     Sig: TAKE 1 TABLET(0.25 MG) BY MOUTH DAILY AS NEEDED FOR ANXIETY. CONTINUE ATARAX TO ASSIST WITH BENZO WEAN PER PSYCH     Not Delegated - Psychiatry: Anxiolytics/Hypnotics 2 Failed - 01/29/2024  8:36 AM      Failed - This refill cannot be delegated      Failed - Urine Drug Screen completed in last 360 days      Passed - Patient is not pregnant      Passed - Valid encounter within last 6 months    Recent Outpatient Visits           2 months ago Avitaminosis D   [place] [facility] Jacky Kindle, FNP   10 months ago Annual physical exam   [facility] Merita Norton T, FNP   1 year ago Major depression, recurrent, chronic [facility])   [place] [facility] Jacky Kindle, FNP   2 years ago Other insomnia   [place] [facility] Merita Norton T, FNP   2 years ago Major depression, recurrent, chronic [facility])   [place] [facility] Jacky Kindle, FNP       Future Appointments             In 1 month Simmons-Robinson, Tawanna Cooler, MD [facility], [facility]

## 2024-02-20 ENCOUNTER — Telehealth: Payer: Self-pay | Admitting: Psychiatry

## 2024-02-26 ENCOUNTER — Other Ambulatory Visit: Payer: Self-pay | Admitting: Psychiatry

## 2024-02-26 ENCOUNTER — Ambulatory Visit (INDEPENDENT_AMBULATORY_CARE_PROVIDER_SITE_OTHER): Payer: Self-pay | Admitting: Licensed Clinical Social Worker

## 2024-02-26 DIAGNOSIS — F331 Major depressive disorder, recurrent, moderate: Secondary | ICD-10-CM | POA: Diagnosis not present

## 2024-02-26 DIAGNOSIS — F439 Reaction to severe stress, unspecified: Secondary | ICD-10-CM | POA: Diagnosis not present

## 2024-02-26 DIAGNOSIS — F411 Generalized anxiety disorder: Secondary | ICD-10-CM | POA: Diagnosis not present

## 2024-02-26 DIAGNOSIS — F4381 Prolonged grief disorder: Secondary | ICD-10-CM | POA: Diagnosis not present

## 2024-02-26 NOTE — Progress Notes (Signed)
 THERAPIST PROGRESS NOTE  Virtual Visit via Video Note  I connected with Annette Bowers on 02/26/24 at  1:00 PM EDT by a video enabled telemedicine application and verified that I am speaking with the correct person using two identifiers.  Location: Patient: Address on file  Provider: [facility]   I discussed the limitations of evaluation and management by telemedicine and the availability of in person appointments. The patient expressed understanding and agreed to proceed.    I discussed the assessment and treatment plan with the patient. The patient was provided an opportunity to ask questions and all were answered. The patient agreed with the plan and demonstrated an understanding of the instructions.   The patient was advised to call back or seek an in-person evaluation if the symptoms worsen or if the condition fails to improve as anticipated.  I provided 40 minutes of non-face-to-face time during this encounter.   Dereck Leep, LCSW   Session Time: 1:1:40pm  Participation Level: Active  Behavioral Response: CasualAlertEuthymic  Type of Therapy: Individual Therapy  Treatment Goals addressed:      Goal: LTG: [place] will score less than 5 on the Generalized Anxiety Disorder 7 Scale (GAD-7)      Dates: Start:  01/21/24    Expected End:  07/20/24       Disciplines: Interdisciplinary, PROVIDER             Goal: STG:  "address issues from the past and learn to cope"       Dates: Start:  01/21/24    Expected End:  07/20/24       Disciplines: Interdisciplinary, PROVIDER     Goal: LTG: Reduce frequency, intensity, and duration of depression symptoms so that daily functioning is improved     Dates: Start:  01/21/24    Expected End:  07/20/24       Disciplines: Interdisciplinary, PROVIDER             Goal: Demonstrates progress in stages of grief at own pace       ProgressTowards Goals: Initial  Interventions: Supportive and Other: EMDR  Solution  Focused  Summary: Annette Bowers is a 38 y.o. female who presents with anxiety, depression, and bereavement citing sxs to include but  not limited to change in energy/activity; Hopelessness; Sleep (too much or little); Irritability; Worthlessness; Difficulty concentrating; Fatigue; Irritability; Restlessness; Sleep; Tension; Worrying. Pt was oriented times 5. Pt was cooperative and engaged. Pt denies SI/HI/AVH.     Patient utilized session to process current state of grief.  Patient expressed concern she feels she qualifies for diagnosis of PTSD.  Clinician engaged patient in completion of the PCL-5 to assess for PTSD symptoms.  Patient scored a 36 on the PCL-5 for symptoms related to her son's death.   Clinician introduced patient to EMDR and shared resources to educate patient on the components of EMDR.  The patient expressed an interest in exploring EMDR as a treatment option and expressed a desire to research EMDR further on her own.  Clinician will evaluate patient's readiness to begin EMDR treatment next session.  Patient expressed a goal to socialize further as she has observed herself socially isolating as a way to cope.  Reports with the weather improving she feels she has to come out of "hibernation "and engage in a social outing with her best friend and her family.  Clinician worked with patient on establishing a plan of action to reach out to her best friend as well  as construct a plan for what social outing they can partake in in the coming weekends.  Suicidal/Homicidal: Nowithout intent/plan  Therapist Response: Clinician utilized active and supportive reflection to create a safe space for patient to process recent life events.  Clinician assessed for current stressors, symptoms, safety since last session.  Assessed patient's trauma symptoms by completion of the PCL-5.  Educated patient on EMDR.  Utilize solution focused interventions to aid in establishing a plan for social  outing.  Plan: Return again in 2 weeks.  Diagnosis: Moderate episode of recurrent major depressive disorder (HCC)  Trauma and stressor-related disorder  GAD (generalized anxiety disorder)  Persistent complex bereavement disorder  Cln will continue to assess if patients presentation of trauma sxs qualifies for a diagnosis of PTSD.    Collaboration of Care: AEB psychiatrist can access notes and cln. Will review psychiatrists' notes. Check in with the patient and will see LCSW per availability. Patient agreed with treatment recommendations.   Patient/Guardian was advised Release of Information must be obtained prior to any record release in order to collaborate their care with an outside provider. Patient/Guardian was advised if they have not already done so to contact the registration department to sign all necessary forms in order for [place] to release information regarding their care.   Consent: Patient/Guardian gives verbal consent for treatment and assignment of benefits for services provided during this visit. Patient/Guardian expressed understanding and agreed to proceed.   Dereck Leep, LCSW 02/26/2024

## 2024-03-01 ENCOUNTER — Other Ambulatory Visit: Payer: Self-pay | Admitting: Family Medicine

## 2024-03-01 DIAGNOSIS — F419 Anxiety disorder, unspecified: Secondary | ICD-10-CM

## 2024-03-01 NOTE — Telephone Encounter (Signed)
 Requested medication (s) are due for refill today: Yes  Requested medication (s) are on the active medication list: Yes  Last refill:  01/29/24  Future visit scheduled: Yes  Notes to clinic:  Unable to refill per protocol, cannot delegate.      Requested Prescriptions  Pending Prescriptions Disp Refills   ALPRAZolam (XANAX) 0.25 MG tablet [Pharmacy Med Name: ALPRAZOLAM 0.25MG  TABLETS] 30 tablet     Sig: TAKE 1 TABLET(0.25 MG) BY MOUTH DAILY AS NEEDED FOR ANXIETY. CONTINUE ATARAX TO ASSIST WITH BENZO WEAN PER PSYCH     Not Delegated - Psychiatry: Anxiolytics/Hypnotics 2 Failed - 03/01/2024  4:27 PM      Failed - This refill cannot be delegated      Failed - Urine Drug Screen completed in last 360 days      Passed - Patient is not pregnant      Passed - Valid encounter within last 6 months    Recent Outpatient Visits           3 months ago Avitaminosis D   [place] [facility] Kindle, FNP   11 months ago Annual physical exam   [place] [facility] Merita Norton T, FNP   1 year ago Major depression, recurrent, chronic [facility])   [place] [facility] Jacky Kindle, FNP   2 years ago Other insomnia   [place] [address] [facility] Merita Norton T, FNP   2 years ago Major depression, recurrent, chronic [facility])   [place] [facility] Jacky Kindle, FNP       Future Appointments             In 2 weeks Simmons-Robinson, Tawanna Cooler, MD [facility], [facility]

## 2024-03-12 ENCOUNTER — Ambulatory Visit (INDEPENDENT_AMBULATORY_CARE_PROVIDER_SITE_OTHER): Payer: Self-pay | Admitting: Licensed Clinical Social Worker

## 2024-03-12 DIAGNOSIS — F331 Major depressive disorder, recurrent, moderate: Secondary | ICD-10-CM | POA: Diagnosis not present

## 2024-03-12 DIAGNOSIS — F411 Generalized anxiety disorder: Secondary | ICD-10-CM

## 2024-03-12 DIAGNOSIS — F4381 Prolonged grief disorder: Secondary | ICD-10-CM

## 2024-03-12 DIAGNOSIS — F439 Reaction to severe stress, unspecified: Secondary | ICD-10-CM | POA: Diagnosis not present

## 2024-03-12 NOTE — Progress Notes (Signed)
 THERAPIST PROGRESS NOTE  Virtual Visit via Video Note  I connected with Annette Bowers on 03/12/24 at 10:00 AM EDT by a video enabled telemedicine application and verified that I am speaking with the correct person using two identifiers.  Location: Patient: Address on file  Provider: Providers home address   I discussed the limitations of evaluation and management by telemedicine and the availability of in person appointments. The patient expressed understanding and agreed to proceed.  I discussed the assessment and treatment plan with the patient. The patient was provided an opportunity to ask questions and all were answered. The patient agreed with the plan and demonstrated an understanding of the instructions.   The patient was advised to call back or seek an in-person evaluation if the symptoms worsen or if the condition fails to improve as anticipated.  I provided 45 minutes of non-face-to-face time during this encounter.   Dereck Leep, LCSW   Session Time: 10-10:45am  Participation Level: Active  Behavioral Response: CasualAlertDepressed  Type of Therapy: Individual Therapy  Treatment Goals addressed:  Goal: LTG: Lakedra will score less than 5 on the Generalized Anxiety Disorder 7 Scale (GAD-7)      Dates: Start:  01/21/24    Expected End:  07/20/24       Disciplines: Interdisciplinary, PROVIDER                Goal: STG:  "address issues from the past and learn to cope"       Dates: Start:  01/21/24    Expected End:  07/20/24       Disciplines: Interdisciplinary, PROVIDER             Goal: LTG: Reduce frequency, intensity, and duration of depression symptoms so that daily functioning is improved     Dates: Start:  01/21/24    Expected End:  07/20/24       Disciplines: Interdisciplinary, PROVIDER                Goal: Demonstrates progress in stages of grief at own pace       ProgressTowards Goals: Progressing  Interventions: Supportive and  EMDR  Summary: Annette Bowers is a 38 y.o. female who presents with anxiety, depression, and bereavement citing sxs to include but  not limited to change in energy/activity; Hopelessness; Sleep (too much or little); Irritability; Worthlessness; Difficulty concentrating; Fatigue; Irritability; Restlessness; Sleep; Tension; Worrying. Pt was oriented times 5. Pt was cooperative and engaged. Pt denies SI/HI/AVH.   The patient reports she has been planning family trips for the summer. Shares anxiety related a recent trigger around her son's death.    Patient consented to beginning EMDR as discussed in last session. Cln educated patient on breathing techniques such as acupressure breathing, belly breathing, 7-11 breathing, and eye roll breathing. The patient identified 7/11 breathing to be he most effective and will practice this skill routinely. Reports she is hopeful about these skills in helping her manage her anxiety but also, cope with difficult days leading up to reminders regarding her son.   **The cln experienced a brief disruption where the wifi went down. The patient was disconnected from the call for around 5 minutes.    Suicidal/Homicidal: Nowithout intent/plan  Therapist Response: Clinician utilized active and supportive reflection to create a safe space for patient to process recent life events. Clinician assessed for current stressors, symptoms, safety since last session. Assessed patient's trauma symptoms by completion of the PCL-5. Educated patient on EMDR.  Utilize solution focused interventions to aid in establishing a plan for social outing.   Plan: Return again in 2 weeks.  Diagnosis: Moderate episode of recurrent major depressive disorder (HCC)  Trauma and stressor-related disorder  GAD (generalized anxiety disorder)  Persistent complex bereavement disorder  Collaboration of Care: AEB psychiatrist can access notes and cln. Will review psychiatrists' notes. Check in with the  patient and will see LCSW per availability. Patient agreed with treatment recommendations.   Patient/Guardian was advised Release of Information must be obtained prior to any record release in order to collaborate their care with an outside provider. Patient/Guardian was advised if they have not already done so to contact the registration department to sign all necessary forms in order for [place] to release information regarding their care.   Consent: Patient/Guardian gives verbal consent for treatment and assignment of benefits for services provided during this visit. Patient/Guardian expressed understanding and agreed to proceed.   Dereck Leep, LCSW 03/12/2024

## 2024-03-15 ENCOUNTER — Encounter: Payer: Self-pay | Admitting: Psychiatry

## 2024-03-15 ENCOUNTER — Telehealth (INDEPENDENT_AMBULATORY_CARE_PROVIDER_SITE_OTHER): Payer: Self-pay | Admitting: Psychiatry

## 2024-03-15 DIAGNOSIS — F4381 Prolonged grief disorder: Secondary | ICD-10-CM

## 2024-03-15 DIAGNOSIS — F411 Generalized anxiety disorder: Secondary | ICD-10-CM | POA: Diagnosis not present

## 2024-03-15 DIAGNOSIS — F331 Major depressive disorder, recurrent, moderate: Secondary | ICD-10-CM

## 2024-03-15 MED ORDER — MIRTAZAPINE 7.5 MG PO TABS: 7.5000 mg | ORAL_TABLET | Freq: Every day | ORAL | 1 refills | Status: DC
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
## 2024-03-15 NOTE — Patient Instructions (Signed)
Mirtazapine Tablets What is this medication? MIRTAZAPINE (mir TAZ a peen) treats depression. It increases the amount of serotonin and norepinephrine in the brain, hormones that help regulate mood. This medicine may be used for other purposes; ask your health care provider or pharmacist if you have questions. COMMON BRAND NAME(S): Remeron What should I tell my care team before I take this medication? They need to know if you have any of these conditions: Bipolar disorder Glaucoma Kidney disease Liver disease Suicidal thoughts An unusual or allergic reaction to mirtazapine, other medications, foods, dyes, or preservatives Pregnant or trying to get pregnant Breast-feeding How should I use this medication? Take this medication by mouth with a glass of water. Follow the directions on the prescription label. Take your medication at regular intervals. Do not take your medication more often than directed. Do not stop taking this medication suddenly except upon the advice of your care team. Stopping this medication too quickly may cause serious side effects or your condition may worsen. A special MedGuide will be given to you by the pharmacist with each prescription and refill. Be sure to read this information carefully each time. Talk to your care team about the use of this medication in children. Special care may be needed. Overdosage: If you think you have taken too much of this medicine contact a poison control center or emergency room at once. NOTE: This medicine is only for you. Do not share this medicine with others. What if I miss a dose? If you miss a dose, take it as soon as you can. If it is almost time for your next dose, take only that dose. Do not take double or extra doses. What may interact with this medication? Do not take this medication with any of the following: Linezolid MAOIs, such as Carbex, Eldepryl, Marplan, Nardil, and Parnate Methylene blue (injected into a vein) This  medication may also interact with the following: Alcohol Antivirals for HIV or AIDS Certain medications that treat or prevent blood clots, such as warfarin Certain medications for fungal infections, such as ketoconazole and itraconazole Certain medications for mental health conditions Certain medications for migraine headache, such as almotriptan, eletriptan, frovatriptan, naratriptan, rizatriptan, sumatriptan, zolmitriptan Certain medications for seizures, such as carbamazepine or phenytoin Certain medications for sleep Cimetidine Erythromycin Fentanyl Lithium Medications for blood pressure Nefazodone Rasagiline Rifampin Supplements, such as St. John's wort, kava kava, valerian Tramadol Tryptophan This list may not describe all possible interactions. Give your health care provider a list of all the medicines, herbs, non-prescription drugs, or dietary supplements you use. Also tell them if you smoke, drink alcohol, or use illegal drugs. Some items may interact with your medicine. What should I watch for while using this medication? Visit your care team for regular checks on your progress. It may be some time before you see the benefit from this medication. This medication may cause thoughts of suicide or depression. This includes sudden changes in mood, behaviors, or thoughts. These changes can happen at any time but are more common in the beginning of treatment or after a change in dose. Call your care team right away if you experience these thoughts or worsening depression. This medication may affect your coordination, reaction time, or judgment. Do not drive or operate machinery until you know how this medication affects you. Sit up or stand slowly to reduce the risk of dizzy or fainting spells. Drinking alcohol with this medication can increase the risk of these side effects. This medication may cause dry  eyes and blurred vision. If you wear contact lenses, you may feel some discomfort.  Lubricating eye drops may help. See your care team if the problem does not go away or is severe. Your mouth may get dry. Chewing sugarless gum or sucking hard candy and drinking plenty of water may help. Contact your care team if the problem does not go away or is severe. What side effects may I notice from receiving this medication? Side effects that you should report to your care team as soon as possible: Allergic reactions--skin rash, itching, hives, swelling of the face, lips, tongue, or throat Heart rhythm changes--fast or irregular heartbeat, dizziness, feeling faint or lightheaded, chest pain, trouble breathing Infection--fever, chills, cough, or sore throat Irritability, confusion, fast or irregular heartbeat, muscle stiffness, twitching muscles, sweating, high fever, seizure, chills, vomiting, diarrhea, which may be signs of serotonin syndrome Low sodium level--muscle weakness, fatigue, dizziness, headache, confusion Rash, fever, and swollen lymph nodes Redness, blistering, peeling or loosening of the skin, including inside the mouth Seizures Sudden eye pain or change in vision such as blurry vision, seeing halos around lights, vision loss Thoughts of suicide or self-harm, worsening mood, feelings of depression Side effects that usually do not require medical attention (report to your care team if they continue or are bothersome): Constipation Dizziness Drowsiness Dry mouth Increase in appetite Weight gain This list may not describe all possible side effects. Call your doctor for medical advice about side effects. You may report side effects to FDA at 1-800-FDA-1088. Where should I keep my medication? Keep out of the reach of children. Store at room temperature between 15 and 30 degrees C (59 and 86 degrees F) Protect from light and moisture. Throw away any unused medication after the expiration date. NOTE: This sheet is a summary. It may not cover all possible information. If you  have questions about this medicine, talk to your doctor, pharmacist, or health care provider.  2024 Elsevier/Gold Standard (2022-07-24 00:00:00)

## 2024-03-18 ENCOUNTER — Encounter: Payer: Self-pay | Admitting: Family Medicine

## 2024-03-18 ENCOUNTER — Ambulatory Visit (INDEPENDENT_AMBULATORY_CARE_PROVIDER_SITE_OTHER): Payer: Self-pay | Admitting: Family Medicine

## 2024-03-18 VITALS — BP 96/77 | HR 111 | Ht 61.0 in | Wt 111.0 lb

## 2024-03-18 DIAGNOSIS — F331 Major depressive disorder, recurrent, moderate: Secondary | ICD-10-CM

## 2024-03-18 DIAGNOSIS — F411 Generalized anxiety disorder: Secondary | ICD-10-CM | POA: Diagnosis not present

## 2024-03-18 DIAGNOSIS — G4709 Other insomnia: Secondary | ICD-10-CM

## 2024-03-18 DIAGNOSIS — Z130 Encounter for screening for diseases of the blood and blood-forming organs and certain disorders involving the immune mechanism: Secondary | ICD-10-CM

## 2024-03-18 DIAGNOSIS — Z131 Encounter for screening for diabetes mellitus: Secondary | ICD-10-CM

## 2024-03-18 DIAGNOSIS — E559 Vitamin D deficiency, unspecified: Secondary | ICD-10-CM

## 2024-03-18 DIAGNOSIS — Z0000 Encounter for general adult medical examination without abnormal findings: Secondary | ICD-10-CM | POA: Diagnosis not present

## 2024-03-18 DIAGNOSIS — Z13220 Encounter for screening for lipoid disorders: Secondary | ICD-10-CM

## 2024-03-18 DIAGNOSIS — Z23 Encounter for immunization: Secondary | ICD-10-CM

## 2024-03-18 NOTE — Progress Notes (Signed)
 Complete physical exam   Patient: Annette Bowers   DOB: 16-Jan-1986   37 y.o. Female  MRN: [id number] Visit Date: 03/18/2024  Today's healthcare provider: Mimi Alt, MD   Chief Complaint  Patient presents with   Establish Care    No concerns    Subjective    Annette Bowers is a 38 y.o. female who presents today for a complete physical exam.   She reports consuming a general diet.    Patient walks  her hdogs      She does not have additional problems to discuss today.   Discussed the use of AI scribe software for clinical note transcription with the patient, who gave verbal consent to proceed.  History of Present Illness Annette Bowers is a 38 year old female who presents for an annual physical exam.  She has no current health concerns or symptoms that require immediate attention.  Her diet is primarily composed of home-cooked meals due to food prices, with occasional dining out with family once every other weekend.  She is less active during the winter months but has increased her physical activity with the arrival of better weather by taking her dogs for walks. Working from home allows her to move around while on the phone.  She has no current need for medication refills and is not planning to receive any COVID booster vaccines in the near future.  She is following up with behavioral health for some medications, with prescriptions coming from both her psychiatrist and the current practice.     Past Medical History:  Diagnosis Date   Allergy    Anemia    Anxiety    Encounter for screening for HIV 09/19/2022   Insomnia    Migraine    Moderate major depression (HCC)    Past Surgical History:  Procedure Laterality Date   WISDOM TOOTH EXTRACTION  2011   Social History   Socioeconomic History   Marital status: Significant Other    Spouse name: Not on file   Number of children: 1   Years of education: Not on file   Highest education  level: Associate degree: occupational, Scientist, product/process development, or vocational program  Occupational History    Employer: [employer]  Tobacco Use   Smoking status: Former    Current packs/day: 0.50    Average packs/day: 0.5 packs/day for 8.0 years (4.0 ttl pk-yrs)    Types: Cigarettes   Smokeless tobacco: Current   Tobacco comments:    Vaping daily  Vaping Use   Vaping status: Every Day  Substance and Sexual Activity   Alcohol use: Yes    Alcohol/week: 3.0 - 4.0 standard drinks of alcohol    Types: 3 - 4 Glasses of wine per week   Drug use: No   Sexual activity: Yes    Birth control/protection: None  Other Topics Concern   Not on file  Social History Narrative   Not on file   Social Drivers of Health   Financial Resource Strain: High Risk (11/11/2023)   Overall Financial Resource Strain (CARDIA)    Difficulty of Paying Living Expenses: Hard  Food Insecurity: Food Insecurity Present (11/11/2023)   Hunger Vital Sign    Worried About Running Out of Food in the Last Year: Sometimes true    Ran Out of Food in the Last Year: Sometimes true  Transportation Needs: Unmet Transportation Needs (11/11/2023)   PRAPARE - Administrator, Civil Service (Medical): No  Lack of Transportation (Non-Medical): Yes  Physical Activity: Sufficiently Active (11/11/2023)   Exercise Vital Sign    Days of Exercise per Week: 5 days    Minutes of Exercise per Session: 30 min  Stress: Stress Concern Present (11/11/2023)   [employer] of Occupational Health - Occupational Stress Questionnaire    Feeling of Stress : Very much  Social Connections: Moderately Isolated (11/11/2023)   Social Connection and Isolation Panel [NHANES]    Frequency of Communication with Friends and Family: More than three times a week    Frequency of Social Gatherings with Friends and Family: Once a week    Attends Religious Services: 1 to 4 times per year    Active Member of [employer] or Organizations: No    Attends Museum/gallery exhibitions officer: Not on file    Marital Status: Divorced  Intimate Partner Violence: Not At Risk (04/21/2019)   Humiliation, Afraid, Rape, and Kick questionnaire    Fear of Current or Ex-Partner: No    Emotionally Abused: No    Physically Abused: No    Sexually Abused: No   Family Status  Relation Name Status   Mother  Alive   Father  Alive   Mat Uncle  (Not Specified)   MGF  (Not Specified)   MGM  (Not Specified)   PGM  (Not Specified)   Cousin  (Not Specified)   Neg Hx  (Not Specified)  No partnership data on file   Family History  Problem Relation Age of Onset   Hyperlipidemia Mother    Hypertension Mother    Diabetes Father    Hypertension Father    Hypertension Maternal Uncle    Diabetes Maternal Grandfather    Diabetes Maternal Grandmother    Heart disease Maternal Grandmother    Hypertension Maternal Grandmother    Arthritis Maternal Grandmother    Kidney failure Maternal Grandmother    Lung cancer Paternal Grandmother    Cancer Paternal Grandmother    Multiple sclerosis Cousin    Mental illness Neg Hx    Allergies  Allergen Reactions   Promethazine Hcl     drowsy   Topiramate     drowsy     Medications: Outpatient Medications Prior to Visit  Medication Sig   ALPRAZolam (XANAX) 0.25 MG tablet TAKE 1 TABLET(0.25 MG) BY MOUTH DAILY AS NEEDED FOR ANXIETY. CONTINUE ATARAX TO ASSIST WITH BENZO WEAN PER PSYCH   hydrOXYzine (ATARAX) 25 MG tablet Take 0.5-1 tablets (12.5-25 mg total) by mouth 3 (three) times daily as needed for anxiety.   lamoTRIgine (LAMICTAL) 100 MG tablet Take 1 tablet (100 mg total) by mouth daily. Start taking after stopping Lamictal 75 mg daily   mirtazapine (REMERON) 7.5 MG tablet Take 1 tablet (7.5 mg total) by mouth at bedtime.   sertraline (ZOLOFT) 100 MG tablet Take 2 tablets (200 mg total) by mouth daily.   Vitamin D, Ergocalciferol, (DRISDOL) 1.25 MG (50000 UNIT) CAPS capsule Take 1 capsule (50,000 Units total) by mouth every 7  (seven) days.   No facility-administered medications prior to visit.    Review of Systems  Last CBC Lab Results  Component Value Date   WBC 12.0 (H) 03/18/2024   HGB 14.0 03/18/2024   HCT 40.8 03/18/2024   MCV 93 03/18/2024   MCH 32.0 03/18/2024   RDW 11.8 03/18/2024   PLT 305 03/18/2024   Last metabolic panel Lab Results  Component Value Date   GLUCOSE 92 11/11/2023   NA 139 11/11/2023  K 3.9 11/11/2023   CL 96 11/11/2023   CO2 27 11/11/2023   BUN 9 11/11/2023   CREATININE 0.79 11/11/2023   EGFR 100 11/11/2023   CALCIUM 9.8 11/11/2023   PROT 7.7 11/11/2023   ALBUMIN 5.0 (H) 11/11/2023   LABGLOB 2.7 11/11/2023   AGRATIO 2.0 03/18/2023   BILITOT 0.3 11/11/2023   ALKPHOS 49 11/11/2023   AST 16 11/11/2023   ALT 13 11/11/2023   ANIONGAP 6 (L) 09/01/2013   Last lipids Lab Results  Component Value Date   CHOL 202 (H) 03/18/2024   HDL 64 03/18/2024   LDLCALC 118 (H) 03/18/2024   TRIG 114 03/18/2024   CHOLHDL 3.2 03/18/2024   Last hemoglobin A1c Lab Results  Component Value Date   HGBA1C 5.1 03/18/2024   Last thyroid functions Lab Results  Component Value Date   TSH 0.608 03/18/2024   Last vitamin D Lab Results  Component Value Date   VD25OH 15.4 (L) 03/18/2024   Last vitamin B12 and Folate Lab Results  Component Value Date   VITAMINB12 570 09/19/2022   FOLATE 3.8 09/19/2022       Objective    BP 96/77   Pulse (!) 111   Ht 5\' 1"  (1.549 m)   Wt 111 lb (50.3 kg)   SpO2 100%   BMI 20.97 kg/m  BP Readings from Last 3 Encounters:  03/18/24 96/77  11/11/23 115/77  03/18/23 110/88   Wt Readings from Last 3 Encounters:  03/18/24 111 lb (50.3 kg)  11/11/23 110 lb (49.9 kg)  03/18/23 113 lb (51.3 kg)        Physical Exam Vitals reviewed.  Constitutional:      General: She is not in acute distress.    Appearance: Normal appearance. She is not ill-appearing, toxic-appearing or diaphoretic.  HENT:     Head: Normocephalic and atraumatic.      Right Ear: Tympanic membrane and external ear normal. There is no impacted cerumen.     Left Ear: Tympanic membrane and external ear normal. There is no impacted cerumen.     Nose: Nose normal.     Mouth/Throat:     Pharynx: Oropharynx is clear.  Eyes:     General: No scleral icterus.    Extraocular Movements: Extraocular movements intact.     Conjunctiva/sclera: Conjunctivae normal.     Pupils: Pupils are equal, round, and reactive to light.  Cardiovascular:     Rate and Rhythm: Normal rate and regular rhythm.     Pulses: Normal pulses.     Heart sounds: Normal heart sounds. No murmur heard.    No friction rub. No gallop.  Pulmonary:     Effort: Pulmonary effort is normal. No respiratory distress.     Breath sounds: Normal breath sounds. No wheezing, rhonchi or rales.  Abdominal:     General: Bowel sounds are normal. There is no distension.     Palpations: Abdomen is soft. There is no mass.     Tenderness: There is no abdominal tenderness. There is no guarding.  Musculoskeletal:        General: No deformity.     Cervical back: Normal range of motion and neck supple.     Right lower leg: No edema.     Left lower leg: No edema.  Lymphadenopathy:     Cervical: No cervical adenopathy.  Skin:    General: Skin is warm.     Capillary Refill: Capillary refill takes less than 2 seconds.  Findings: No erythema or rash.  Neurological:     General: No focal deficit present.     Mental Status: She is alert and oriented to person, place, and time.     Cranial Nerves: Cranial nerves 2-12 are intact. No cranial nerve deficit or facial asymmetry.     Motor: Motor function is intact. No weakness.     Gait: Gait normal.  Psychiatric:        Mood and Affect: Mood normal.        Behavior: Behavior normal.     =  Last depression screening scores    03/18/2024    1:54 PM 01/21/2024   10:59 AM 11/11/2023    1:18 PM  PHQ 2/9 Scores  PHQ - 2 Score 6  5  PHQ- 9 Score 17  14      Information is confidential and restricted. Go to Review Flowsheets to unlock data.    Last fall risk screening    11/11/2023    1:18 PM  Fall Risk   Falls in the past year? 0  Number falls in past yr: 0  Injury with Fall? 0    Last Audit-C alcohol use screening    11/11/2023    8:25 AM  Alcohol Use Disorder Test (AUDIT)  1. How often do you have a drink containing alcohol? 2  2. How many drinks containing alcohol do you have on a typical day when you are drinking? 1  3. How often do you have six or more drinks on one occasion? 0  AUDIT-C Score 3   4. How often during the last year have you found that you were not able to stop drinking once you had started? 0  5. How often during the last year have you failed to do what was normally expected from you because of drinking? 0  6. How often during the last year have you needed a first drink in the morning to get yourself going after a heavy drinking session? 0  7. How often during the last year have you had a feeling of guilt of remorse after drinking? 0  8. How often during the last year have you been unable to remember what happened the night before because you had been drinking? 0  9. Have you or someone else been injured as a result of your drinking? 0  10. Has a relative or friend or a doctor or another health worker been concerned about your drinking or suggested you cut down? 0  Alcohol Use Disorder Identification Test Final Score (AUDIT) 3      Patient-reported   A score of 3 or more in women, and 4 or more in men indicates increased risk for alcohol abuse, EXCEPT if all of the points are from question 1   No results found for any visits on 03/18/24.  Assessment & Plan    Routine Health Maintenance and Physical Exam  Immunization History  Administered Date(s) Administered   DTP 06/13/1988, 08/15/1988, 10/10/1988, 10/16/1989   HIB, Unspecified 10/16/1989   Hep B, Unspecified 09/24/1999, 11/19/1999, 04/07/2000    Influenza-Unspecified 09/09/2016   MMR 10/16/1989   OPV 06/13/1988, 08/15/1988, 10/16/1989   Tdap 05/22/2006    Health Maintenance  Topic Date Due   COVID-19 Vaccine (1) Never done   DTaP/Tdap/Td (6 - Td or Tdap) 05/22/2016   Cervical Cancer Screening (HPV/Pap Cotest)  Never done   INFLUENZA VACCINE  07/09/2024   Hepatitis C Screening  Completed   HIV Screening  Completed   HPV VACCINES  Aged Out   Meningococcal B Vaccine  Aged Out    Problem List Items Addressed This Visit       Other   Moderate episode of recurrent major depressive disorder (HCC)   Insomnia (Chronic)   GAD (generalized anxiety disorder)   Avitaminosis D   Annual physical exam - Primary   Other Visit Diagnoses       Screening for diabetes mellitus         Screening for lipid disorders         Screening, anemia, deficiency, iron         Need for DTaP vaccine            Assessment & Plan Annual Physical Examination Routine annual physical examination. Discussed diet and exercise habits. She cooks at home and exercises by walking dogs. Emphasized the importance of regular exercise and a balanced diet for overall health maintenance. - Order CBC, A1c, CMP, TSH, G4, T3, and Vitamin D level - Advise 950 minutes of exercise per week - Encourage a well-balanced diet  General Health Maintenance Discussed general health maintenance including vaccinations and screenings. She is not planning to receive a COVID booster vaccine in the near future. Emphasized the importance of staying up-to-date with vaccinations and screenings for preventive health. - Administer updated tetanus vaccine - Recommend cervical cancer screening - Discuss COVID booster vaccine as needed  Follow-up Routine follow-up to monitor health status and medication management. Coordination with behavioral health for medication management. Suggested a six-month follow-up schedule to ensure appropriate medication dosing and management. -  Schedule follow-up appointment in October - Coordinate with behavioral health for medication management - Advise her to contact if any issues arise before the next scheduled visit       No follow-ups on file.       Mimi Alt, MD  [facility] [phone] (phone) [phone] (fax)  [facility]

## 2024-03-19 LAB — CBC
Hematocrit: 40.8 % (ref 34.0–46.6)
Hemoglobin: 14 g/dL (ref 11.1–15.9)
MCH: 32 pg (ref 26.6–33.0)
MCHC: 34.3 g/dL (ref 31.5–35.7)
MCV: 93 fL (ref 79–97)
Platelets: 305 10*3/uL (ref 150–450)
RBC: 4.37 x10E6/uL (ref 3.77–5.28)
RDW: 11.8 % (ref 11.7–15.4)
WBC: 12 10*3/uL — ABNORMAL HIGH (ref 3.4–10.8)

## 2024-03-19 LAB — HEMOGLOBIN A1C
Est. average glucose Bld gHb Est-mCnc: 100 mg/dL
Hgb A1c MFr Bld: 5.1 % (ref 4.8–5.6)

## 2024-03-19 LAB — VITAMIN D 25 HYDROXY (VIT D DEFICIENCY, FRACTURES): Vit D, 25-Hydroxy: 15.4 ng/mL — ABNORMAL LOW (ref 30.0–100.0)

## 2024-03-19 LAB — TSH+T4F+T3FREE
Free T4: 1.07 ng/dL (ref 0.82–1.77)
T3, Free: 2.4 pg/mL (ref 2.0–4.4)
TSH: 0.608 u[IU]/mL (ref 0.450–4.500)

## 2024-03-19 LAB — LIPID PANEL
Chol/HDL Ratio: 3.2 ratio (ref 0.0–4.4)
Cholesterol, Total: 202 mg/dL — ABNORMAL HIGH (ref 100–199)
HDL: 64 mg/dL (ref >39)
LDL Chol Calc (NIH): 118 mg/dL — ABNORMAL HIGH (ref 0–99)
Triglycerides: 114 mg/dL (ref 0–149)
VLDL Cholesterol Cal: 20 mg/dL (ref 5–40)

## 2024-03-20 ENCOUNTER — Encounter: Payer: Self-pay | Admitting: Family Medicine

## 2024-03-26 ENCOUNTER — Ambulatory Visit (INDEPENDENT_AMBULATORY_CARE_PROVIDER_SITE_OTHER): Payer: Self-pay | Admitting: Licensed Clinical Social Worker

## 2024-03-26 DIAGNOSIS — F331 Major depressive disorder, recurrent, moderate: Secondary | ICD-10-CM | POA: Diagnosis not present

## 2024-03-26 DIAGNOSIS — F411 Generalized anxiety disorder: Secondary | ICD-10-CM | POA: Diagnosis not present

## 2024-03-26 DIAGNOSIS — F4381 Prolonged grief disorder: Secondary | ICD-10-CM

## 2024-03-26 DIAGNOSIS — F439 Reaction to severe stress, unspecified: Secondary | ICD-10-CM | POA: Diagnosis not present

## 2024-03-26 NOTE — Progress Notes (Signed)
 THERAPIST PROGRESS NOTE  Virtual Visit via Video Note  I connected with Annette Bowers Friend on 03/26/24 at  9:00 AM EDT by a video enabled telemedicine application and verified that I am speaking with the correct person using two identifiers.  Location: Patient: Address on file  Provider: Providers address   I discussed the limitations of evaluation and management by telemedicine and the availability of in person appointments. The patient expressed understanding and agreed to proceed.   I discussed the assessment and treatment plan with the patient. The patient was provided an opportunity to ask questions and all were answered. The patient agreed with the plan and demonstrated an understanding of the instructions.   The patient was advised to call back or seek an in-person evaluation if the symptoms worsen or if the condition fails to improve as anticipated.  I provided 58 minutes of non-face-to-face time during this encounter.   Annette Bowers Husband, LCSW   Session Time: 9-9:58am  Participation Level: Active  Behavioral Response: CasualAlertEuthymic  Type of Therapy: Individual Therapy  Treatment Goals addressed:  Goal: LTG: Chisa will score less than 5 on the Generalized Anxiety Disorder 7 Scale (GAD-7)      Dates: Start:  01/21/24    Expected End:  07/20/24       Disciplines: Interdisciplinary, PROVIDER                Goal: STG:  address issues from the past and learn to cope       Dates: Start:  01/21/24    Expected End:  07/20/24       Disciplines: Interdisciplinary, PROVIDER                       Goal: LTG: Reduce frequency, intensity, and duration of depression symptoms so that daily functioning is improved     Dates: Start:  01/21/24    Expected End:  07/20/24       Disciplines: Interdisciplinary, PROVIDER                 Goal: Demonstrates progress in stages of grief at own pace        ProgressTowards Goals: Progressing  Interventions: Supportive  and Other: EMDR  Summary: Annette Bowers is a 38 y.o. female who presents with anxiety, depression, and bereavement citing sxs to include but  not limited to change in energy/activity; Hopelessness; Sleep (too much or little); Irritability; Worthlessness; Difficulty concentrating; Fatigue; Irritability; Restlessness; Sleep; Tension; Worrying. Pt was oriented times 5. Pt was cooperative and engaged. Pt denies SI/HI/AVH.    The clinician began the session by assessing the patient's use of coping skills from the last session. The patient shared her successful use of breathing techniques. The clinician and patient continued with EMDR therapy by constructing the patient's container and peaceful place. Positive connections were instilled, and the patient will practice these skills routinely.  The clinician and patient developed a target sequence plan for the negative cognition, I am overwhelmed. They reflected on memories that reinforced this negative belief. The patient expressed feelings of being overwhelmed related to the upcoming Mother's Day holiday, as it triggers memories of her son and mother.  For the next session, the patient will begin processing her target sequence plan and/or address the negative feelings associated with Mother's Day.  Suicidal/Homicidal: without intent/planNo  Therapist Response: Clinician utilized active and supportive reflection to create a safe space for patient to process recent life events. Clinician assessed for  current stressors, symptoms, safety since last session. Continued EMDR with resourcing and constructing the TSP.  Plan: Return again in 2 weeks.  Diagnosis: Persistent complex bereavement disorder  Moderate episode of recurrent major depressive disorder (HCC)  GAD (generalized anxiety disorder)  Trauma and stressor-related disorder   Collaboration of Care: AEB psychiatrist can access notes and cln. Will review psychiatrists' notes. Check in with the  patient and will see LCSW per availability. Patient agreed with treatment recommendations.   Patient/Guardian was advised Release of Information must be obtained prior to any record release in order to collaborate their care with an outside provider. Patient/Guardian was advised if they have not already done so to contact the registration department to sign all necessary forms in order for us  to release information regarding their care.   Consent: Patient/Guardian gives verbal consent for treatment and assignment of benefits for services provided during this visit. Patient/Guardian expressed understanding and agreed to proceed.   Annette Bowers Husband, LCSW 03/26/2024

## 2024-03-29 ENCOUNTER — Other Ambulatory Visit: Payer: Self-pay | Admitting: Physician Assistant

## 2024-03-29 DIAGNOSIS — F419 Anxiety disorder, unspecified: Secondary | ICD-10-CM

## 2024-04-07 ENCOUNTER — Ambulatory Visit: Payer: Self-pay | Admitting: Licensed Clinical Social Worker

## 2024-04-14 ENCOUNTER — Ambulatory Visit (INDEPENDENT_AMBULATORY_CARE_PROVIDER_SITE_OTHER): Admitting: Licensed Clinical Social Worker

## 2024-04-14 ENCOUNTER — Ambulatory Visit: Payer: Self-pay | Admitting: Licensed Clinical Social Worker

## 2024-04-14 DIAGNOSIS — F411 Generalized anxiety disorder: Secondary | ICD-10-CM | POA: Diagnosis not present

## 2024-04-14 DIAGNOSIS — F4381 Prolonged grief disorder: Secondary | ICD-10-CM | POA: Diagnosis not present

## 2024-04-14 DIAGNOSIS — F331 Major depressive disorder, recurrent, moderate: Secondary | ICD-10-CM | POA: Diagnosis not present
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
## 2024-04-16 ENCOUNTER — Telehealth (INDEPENDENT_AMBULATORY_CARE_PROVIDER_SITE_OTHER): Payer: Self-pay | Admitting: Psychiatry

## 2024-04-16 ENCOUNTER — Encounter: Payer: Self-pay | Admitting: Psychiatry

## 2024-04-16 DIAGNOSIS — F411 Generalized anxiety disorder: Secondary | ICD-10-CM

## 2024-04-16 DIAGNOSIS — F4381 Prolonged grief disorder: Secondary | ICD-10-CM | POA: Diagnosis not present

## 2024-04-16 DIAGNOSIS — F3341 Major depressive disorder, recurrent, in partial remission: Secondary | ICD-10-CM

## 2024-04-16 MED ORDER — SERTRALINE HCL 100 MG PO TABS: 200.0000 mg | ORAL_TABLET | Freq: Every day | ORAL | 1 refills | Status: DC

## 2024-04-16 MED ORDER — LAMOTRIGINE 100 MG PO TABS: 100.0000 mg | ORAL_TABLET | Freq: Every day | ORAL | 1 refills | Status: DC

## 2024-04-16 NOTE — Progress Notes (Signed)
 Virtual Visit via Video Note  I connected with Annette Bowers on 04/16/24 at  9:30 AM EDT by a video enabled telemedicine application and verified that I am speaking with the correct person using two identifiers.  Location Provider Location : [facility] Patient Location : Home  Participants: Patient , Provider    I discussed the limitations of evaluation and management by telemedicine and the availability of in person appointments. The patient expressed understanding and agreed to proceed.   I discussed the assessment and treatment plan with the patient. The patient was provided an opportunity to ask questions and all were answered. The patient agreed with the plan and demonstrated an understanding of the instructions.   The patient was advised to call back or seek an in-person evaluation if the symptoms worsen or if the condition fails to improve as anticipated.   BH MD OP Progress Note  04/16/2024 1:13 PM Annette Bowers  MRN:  [id number]  Chief Complaint:  Chief Complaint  Patient presents with   Follow-up   Depression   Anxiety   Discussed the use of AI scribe software for clinical note transcription with the patient, who gave verbal consent to proceed.  History of Present Illness Annette Bowers is a 38 year old Caucasian female, employed, engaged, lives in [place], has a history of MDD, GAD, persistent complex bereavement disorder, migraine headaches presented for a follow-up appointment.  She is currently taking sertraline  200 mg daily, lamotrigine  100 mg daily, and mirtazapine , which was recently added to her regimen. Mirtazapine  has been beneficial in aiding her sleep. She takes sertraline  and lamotrigine  after breakfast around 10:30 AM.  Her eating habits are stable, and she maintains her best weight. No decrease in appetite is noted, as she has always been a snacker. Her mood is influenced by situational factors, such as upcoming events like Mother's Day, which cause  anxiety.  She continues to work from home, which she finds helpful, and recently received employee of the month recognition.  She is actively engaged in therapy and has started EMDR therapy, which she finds beneficial. She uses coping strategies and breathing techniques provided by her therapist to manage anxiety.  Denies thoughts of self-harm or harm to others .  Denies any perceptual disturbances.  Denies any other concerns today.   Visit Diagnosis:    ICD-10-CM   1. Recurrent major depressive disorder, in partial remission (HCC)  F33.41 sertraline  (ZOLOFT ) 100 MG tablet    2. GAD (generalized anxiety disorder)  F41.1 lamoTRIgine  (LAMICTAL ) 100 MG tablet    3. 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  Past trials of trazodone .  Past Medical History:  Past Medical History:  Diagnosis Date   Allergy    Anemia    Anxiety    Encounter for screening for HIV 09/19/2022   Insomnia    Migraine    Moderate major depression (HCC)     Past Surgical History:  Procedure Laterality Date   WISDOM TOOTH EXTRACTION  2011    Family Psychiatric History: I have reviewed family psychiatric history from progress note on 11/05/2023.  Family History:  Family History  Problem Relation Age of Onset   Hyperlipidemia Mother    Hypertension Mother    Diabetes Father    Hypertension Father    Hypertension Maternal Uncle  Diabetes Maternal Grandfather    Diabetes Maternal Grandmother    Heart disease Maternal Grandmother    Hypertension Maternal Grandmother    Arthritis Maternal Grandmother    Kidney failure Maternal Grandmother    Lung cancer Paternal Grandmother    Cancer Paternal Grandmother    Multiple sclerosis Cousin    Mental illness Neg Hx     Social History: I have reviewed social history from progress note  on 11/05/2023. Social History   Socioeconomic History   Marital status: Significant Other    Spouse name: Not on file   Number of children: 1   Years of education: Not on file   Highest education level: Associate degree: occupational, Scientist, product/process development, or vocational program  Occupational History    Employer: [employer]  Tobacco Use   Smoking status: Former    Current packs/day: 0.50    Average packs/day: 0.5 packs/day for 8.0 years (4.0 ttl pk-yrs)    Types: Cigarettes   Smokeless tobacco: Current   Tobacco comments:    Vaping daily  Vaping Use   Vaping status: Every Day  Substance and Sexual Activity   Alcohol use: Yes    Alcohol/week: 3.0 - 4.0 standard drinks of alcohol    Types: 3 - 4 Glasses of wine per week   Drug use: No   Sexual activity: Yes    Birth control/protection: None  Other Topics Concern   Not on file  Social History Narrative   Not on file   Social Drivers of Health   Financial Resource Strain: High Risk (11/11/2023)   Overall Financial Resource Strain (CARDIA)    Difficulty of Paying Living Expenses: Hard  Food Insecurity: Food Insecurity Present (11/11/2023)   Hunger Vital Sign    Worried About Running Out of Food in the Last Year: Sometimes true    Ran Out of Food in the Last Year: Sometimes true  Transportation Needs: Unmet Transportation Needs (11/11/2023)   PRAPARE - Transportation    Lack of Transportation (Medical): No    Lack of Transportation (Non-Medical): Yes  Physical Activity: Sufficiently Active (11/11/2023)   Exercise Vital Sign    Days of Exercise per Week: 5 days    Minutes of Exercise per Session: 30 min  Stress: Stress Concern Present (11/11/2023)   [employer] of Occupational Health - Occupational Stress Questionnaire    Feeling of Stress : Very much  Social Connections: Moderately Isolated (11/11/2023)   Social Connection and Isolation Panel [NHANES]    Frequency of Communication with Friends and Family: More than three times a  week    Frequency of Social Gatherings with Friends and Family: Once a week    Attends Religious Services: 1 to 4 times per year    Active Member of [employer] or Organizations: No    Attends Engineer, structural: Not on file    Marital Status: Divorced    Allergies:  Allergies  Allergen Reactions   Promethazine Hcl     drowsy   Topiramate     drowsy    Metabolic Disorder Labs: Lab Results  Component Value Date   HGBA1C 5.1 03/18/2024   No results found for: "PROLACTIN" Lab Results  Component Value Date   CHOL 202 (H) 03/18/2024   TRIG 114 03/18/2024   HDL 64 03/18/2024   CHOLHDL 3.2 03/18/2024   LDLCALC 118 (H) 03/18/2024   LDLCALC 117 (H) 11/11/2023   Lab Results  Component Value Date   TSH 0.608 03/18/2024   TSH 1.580 11/11/2023  Therapeutic Level Labs: No results found for: "LITHIUM" No results found for: "VALPROATE" No results found for: "CBMZ"  Current Medications: Current Outpatient Medications  Medication Sig Dispense Refill   ALPRAZolam  (XANAX ) 0.25 MG tablet TAKE 1 TABLET(0.25 MG) BY MOUTH DAILY AS NEEDED FOR ANXIETY. CONTINUE ATARAX  TO ASSIST WITH BENZO WEAN PER PSYCH 30 tablet 0   hydrOXYzine  (ATARAX ) 25 MG tablet Take 0.5-1 tablets (12.5-25 mg total) by mouth 3 (three) times daily as needed for anxiety. 90 tablet 1   lamoTRIgine  (LAMICTAL ) 100 MG tablet Take 1 tablet (100 mg total) by mouth daily. 90 tablet 1   mirtazapine  (REMERON ) 7.5 MG tablet Take 1 tablet (7.5 mg total) by mouth at bedtime. 30 tablet 1   sertraline  (ZOLOFT ) 100 MG tablet Take 2 tablets (200 mg total) by mouth daily. 180 tablet 1   Vitamin D , Ergocalciferol , (DRISDOL ) 1.25 MG (50000 UNIT) CAPS capsule Take 1 capsule (50,000 Units total) by mouth every 7 (seven) days. 26 capsule 1   No current facility-administered medications for this visit.     Musculoskeletal: Strength & Muscle Tone: UTA Gait & Station: Seated Patient leans: N/A  Psychiatric Specialty  Exam: Review of Systems  Psychiatric/Behavioral:  The patient is nervous/anxious.     There were no vitals taken for this visit.There is no height or weight on file to calculate BMI.  General Appearance: Casual  Eye Contact:  Fair  Speech:  Clear and Coherent  Volume:  Normal  Mood:  Anxious  Affect:  Congruent  Thought Process:  Goal Directed and Descriptions of Associations: Intact  Orientation:  Full (Time, Place, and Person)  Thought Content: Logical   Suicidal Thoughts:  No  Homicidal Thoughts:  No  Memory:  Immediate;   Fair Recent;   Fair Remote;   Fair  Judgement:  Fair  Insight:  Fair  Psychomotor Activity:  Normal  Concentration:  Concentration: Fair and Attention Span: Fair  Recall:  [employer] of Knowledge: Fair  Language: Fair  Akathisia:  No  Handed:  Right  AIMS (if indicated): not done  Assets:  Desire for Improvement Housing Social Support Transportation  ADL's:  Intact  Cognition: WNL  Sleep:  Improving   Screenings: AUDIT    Flowsheet Row Office Visit from 11/11/2023 in [facility]  Alcohol Use Disorder Identification Test Final Score (AUDIT) 3       GAD-7    Flowsheet Row Office Visit from 03/18/2024 in [facility] Counselor from 01/21/2024 in [facility] Office Visit from 11/11/2023 in [facility] Office Visit from 11/05/2023 in [facility] Video Visit from 03/27/2021 in [facility]  Total GAD-7 Score 19 17 16 19 18       PHQ2-9    Flowsheet Row Office Visit from 03/18/2024 in [facility] Counselor from 01/21/2024 in [facility] Office Visit from 11/11/2023 in [facility] Office Visit from 11/05/2023 in [facility] Telephone from  03/19/2023 in [facility]  PHQ-2 Total Score 6 4 5 6 4   PHQ-9 Total Score 17 14 14 22 15       Flowsheet Row Video Visit from 04/16/2024 in [facility] Video Visit from 03/15/2024 in [facility] Counselor from 01/21/2024 in [facility]  C-SSRS RISK CATEGORY No Risk No Risk Error: Q3, 4, or 5 should not be populated when Q2 is No        Assessment and Plan: Annette Bowers is a 38 year old Caucasian female who has a history of MDD, GAD was evaluated by telemedicine today.  Discussed assessment and plan as noted below.  Major depressive disorder-in partial remission Currently reports depression symptoms have improved on the current medication regimen.  Continues to be engaged in psychotherapy. - Continue Sertraline  200 mg daily - Continue Lamotrigine  100 mg daily - Continue psychotherapy sessions with Ms. Almetta Jacquet.  GAD-improving Currently reports anxiety symptoms are well managed on the current medications and psychotherapy sessions. - Continue Sertraline  200 mg daily - Continue CBT  Other specified trauma and stress related disorder-persistent bereavement disorder-improving Currently tolerating mirtazapine  and reports sleep problems as improving.  Does have situational grief reaction due to upcoming Mother's Day.  Currently engaged in therapy which has been beneficial. - Continue Mirtazapine  7.5 mg at bedtime - Continue psychotherapy sessions. - Continue Hydroxyzine  25 mg up to 2-3 times a day for severe anxiety attacks only.  Follow-up Follow-up in clinic in 2 months or sooner if needed.   Collaboration of Care: Collaboration of Care: Referral or follow-up with counselor/therapist AEB patient encouraged to continue CBT  Patient/Guardian was advised Release of Information must be obtained prior to any record release  in order to collaborate their care with an outside provider. Patient/Guardian was advised if they have not already done so to contact the registration department to sign all necessary forms in order for us  to release information regarding their care.   Consent: Patient/Guardian gives verbal consent for treatment and assignment of benefits for services provided during this visit. Patient/Guardian expressed understanding and agreed to proceed.  This note was generated in part or whole with voice recognition software. Voice recognition is usually quite accurate but there are transcription errors that can and very often do occur. I apologize for any typographical errors that were not detected and corrected.     Ayansh Feutz, MD 04/16/2024, 1:13 PM

## 2024-04-23 ENCOUNTER — Ambulatory Visit: Payer: Self-pay | Admitting: Licensed Clinical Social Worker

## 2024-04-23 ENCOUNTER — Other Ambulatory Visit: Payer: Self-pay | Admitting: Family Medicine

## 2024-04-23 DIAGNOSIS — F419 Anxiety disorder, unspecified: Secondary | ICD-10-CM

## 2024-05-07 ENCOUNTER — Ambulatory Visit: Admitting: Licensed Clinical Social Worker

## 2024-05-07 DIAGNOSIS — F411 Generalized anxiety disorder: Secondary | ICD-10-CM | POA: Diagnosis not present

## 2024-05-07 DIAGNOSIS — F331 Major depressive disorder, recurrent, moderate: Secondary | ICD-10-CM

## 2024-05-07 DIAGNOSIS — F4381 Prolonged grief disorder: Secondary | ICD-10-CM | POA: Diagnosis not present
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
## 2024-05-20 ENCOUNTER — Ambulatory Visit: Admitting: Licensed Clinical Social Worker

## 2024-05-20 DIAGNOSIS — F411 Generalized anxiety disorder: Secondary | ICD-10-CM | POA: Diagnosis not present

## 2024-05-20 DIAGNOSIS — F4381 Prolonged grief disorder: Secondary | ICD-10-CM | POA: Diagnosis not present

## 2024-05-20 DIAGNOSIS — F331 Major depressive disorder, recurrent, moderate: Secondary | ICD-10-CM

## 2024-05-20 NOTE — Progress Notes (Signed)
 THERAPIST PROGRESS NOTE  Virtual Visit via Video Note  I connected with Annette Bowers on 05/20/24 at  4:00 PM EDT by a video enabled telemedicine application and verified that I am speaking with the correct person using two identifiers.  Location: Patient: Address on file  Provider: [facility]   I discussed the limitations of evaluation and management by telemedicine and the availability of in person appointments. The patient expressed understanding and agreed to proceed.   I discussed the assessment and treatment plan with the patient. The patient was provided an opportunity to ask questions and all were answered. The patient agreed with the plan and demonstrated an understanding of the instructions.   The patient was advised to call back or seek an in-person evaluation if the symptoms worsen or if the condition fails to improve as anticipated.  I provided 55 minutes of non-face-to-face time during this encounter.   Marvin Slot, LCSW   Session Time: 4-4:55 pm  Participation Level: Active  Behavioral Response: CasualAlertEuthymic  Type of Therapy: Individual Therapy  Treatment Goals addressed:  Goal: LTG: Annette Bowers will score less than 5 on the Generalized Anxiety Disorder 7 Scale (GAD-7)      Dates: Start:  01/21/24    Expected End:  07/20/24       Disciplines: Interdisciplinary, PROVIDER                Goal: STG:  address issues from the past and learn to cope       Dates: Start:  01/21/24    Expected End:  07/20/24       Disciplines: Interdisciplinary, PROVIDER                                                                                        Goal: LTG: Reduce frequency, intensity, and duration of depression symptoms so that daily functioning is improved     Dates: Start:  01/21/24    Expected End:  07/20/24       Disciplines: Interdisciplinary, PROVIDER                    Goal: Demonstrates progress in stages of grief at own pace               ProgressTowards Goals: Progressing  Interventions: Supportive and Other: EMDR  Summary: Annette Bowers is a 38 y.o. female who presents with Hopelessness, Sleep (too much or little), Irritability, Worthlessness, Difficulty concentrating, Fatigue, Irritability, Restlessness, Sleep, Tension, and Worrying. Pt was oriented times 5. Pt was cooperative and engaged. Pt denies SI/HI/AVH.   Patient utilized therapeutic space to process unresolved grief related to her loved ones passing.  Reflected on recent stressors citing a family friend passed away and her significant other recently lost his job unexpectedly.  Patient identified she often navigates life expecting the worst case scenario and is lost hope for an optimistic outcome.  Clinician and patient explored her history of grieving since her son's passing with patient reporting she has never let herself grieve.  Clinician and patient explored patient's patterns of avoidance and worked with her to challenge this behavior.  Clinician praised  patient's progress in being able to discuss unresolved grief and therapeutic sessions.  The session got disconnected due to the patient phone dying. The clinician was able to resume contact via phone calls. Closed out the session by walking the patient through 7/11 breathing to return to baseline.   Suicidal/Homicidal: Nowithout intent/plan  Therapist Response: Clinician utilized active and supportive reflection to create a safe space for patient to process recent life events. Clinician assessed for current stressors, symptoms, safety since last session.   Plan: Return again in 2 weeks.  Diagnosis: Persistent complex bereavement disorder  Moderate episode of recurrent major depressive disorder (HCC)  GAD (generalized anxiety disorder)   Collaboration of Care: AEB psychiatrist can access notes and cln. Will review psychiatrists' notes. Check in with the patient and will see LCSW per availability. Patient  agreed with treatment recommendations.   Patient/Guardian was advised Release of Information must be obtained prior to any record release in order to collaborate their care with an outside provider. Patient/Guardian was advised if they have not already done so to contact the registration department to sign all necessary forms in order for us  to release information regarding their care.   Consent: Patient/Guardian gives verbal consent for treatment and assignment of benefits for services provided during this visit. Patient/Guardian expressed understanding and agreed to proceed.   Marvin Slot, LCSW 05/20/2024

## 2024-05-26 ENCOUNTER — Other Ambulatory Visit: Payer: Self-pay | Admitting: Family Medicine

## 2024-05-26 DIAGNOSIS — F419 Anxiety disorder, unspecified: Secondary | ICD-10-CM

## 2024-05-28 ENCOUNTER — Other Ambulatory Visit: Payer: Self-pay

## 2024-05-28 DIAGNOSIS — F419 Anxiety disorder, unspecified: Secondary | ICD-10-CM

## 2024-05-28 NOTE — Telephone Encounter (Signed)
 Requested medications are due for refill today.  yes  Requested medications are on the active medications list.  yes  Last refill. 04/28/2024 #30 0 rf  Future visit scheduled.   yes  Notes to clinic.  Refill not delegated.    Requested Prescriptions  Pending Prescriptions Disp Refills   ALPRAZolam  (XANAX ) 0.25 MG tablet [Pharmacy Med Name: ALPRAZOLAM  0.25MG  TABLETS] 30 tablet     Sig: TAKE 1 TABLET BY MOUTH ONCE DAILY AS NEEDED FOR ANXIETY. CONTINUE HYDROXYZINE  TO ASSIST WITH WEANING AS DIRECTED.     Not Delegated - Psychiatry: Anxiolytics/Hypnotics 2 Failed - 05/28/2024 10:55 AM      Failed - This refill cannot be delegated      Failed - Urine Drug Screen completed in last 360 days      Passed - Patient is not pregnant      Passed - Valid encounter within last 6 months    Recent Outpatient Visits           2 months ago Annual physical exam   [facility] Simmons-Robinson, Judyann Number, MD       Future Appointments             In 3 months Simmons-Robinson, Judyann Number, MD [facility]

## 2024-06-04 ENCOUNTER — Ambulatory Visit (INDEPENDENT_AMBULATORY_CARE_PROVIDER_SITE_OTHER): Admitting: Licensed Clinical Social Worker

## 2024-06-04 DIAGNOSIS — F331 Major depressive disorder, recurrent, moderate: Secondary | ICD-10-CM

## 2024-06-04 DIAGNOSIS — F4381 Prolonged grief disorder: Secondary | ICD-10-CM

## 2024-06-04 DIAGNOSIS — F411 Generalized anxiety disorder: Secondary | ICD-10-CM

## 2024-06-04 NOTE — Progress Notes (Signed)
 THERAPIST PROGRESS NOTE  Virtual Visit via Video Note  I connected with Annette Bowers on 06/04/24 at 11:00 AM EDT by a video enabled telemedicine application and verified that I am speaking with the correct person using two identifiers.  Location: Patient: Address on file  Provider: Providers address   I discussed the limitations of evaluation and management by telemedicine and the availability of in person appointments. The patient expressed understanding and agreed to proceed.   I discussed the assessment and treatment plan with the patient. The patient was provided an opportunity to ask questions and all were answered. The patient agreed with the plan and demonstrated an understanding of the instructions.   The patient was advised to call back or seek an in-person evaluation if the symptoms worsen or if the condition fails to improve as anticipated.  I provided 61 minutes of non-face-to-face time during this encounter.   Annette KATHEE Husband, LCSW   Session Time: 11-12:01pm  Participation Level: Active  Behavioral Response: Casual, Alert, Euthymic  Type of Therapy: Individual Therapy  Treatment Goals addressed:  Active     Anxiety     LTG: Annette Bowers will score less than 5 on the Generalized Anxiety Disorder 7 Scale (GAD-7)      Start:  01/21/24    Expected End:  07/20/24         STG:  address issues from the past and learn to cope       Start:  01/21/24    Expected End:  07/20/24         Work with patient individually to identify the major components of a recent episode of anxiety: physical symptoms, major thoughts and images, and major behaviors they experienced     Start:  01/21/24         Work with Annette to identify a minimum of 3 consequences of avoidance.      Start:  01/21/24         Work with Annette to identify a minimum of 3 alternative coping behaviors to avoidance.      Start:  01/21/24         Coping Skills      Start:  01/21/24        Will work with the pt using CBT/DBT techniques to help the pt verbalize an understanding of the cognitive, physiological, and behavioral components of anxiety and its treatment. This will be done by using worksheets, interactive activities, CBT/ABC thought logs, modeling, homework, role playing and journaling. Will work with pt to learn and implement coping skills that result in a reduction of anxiety and improve daily functioning per pt self report 3 out of 5 documented sessions.         BH CCP Acute or Chronic Trauma Reaction     LTG: Recall traumatic events without becoming overwhelmed with negative emotions     Start:  01/21/24    Expected End:  07/20/24         STG: Annette Bowers will identify coping strategies to deal with trauma memories and the associated emotional reaction     Start:  01/21/24    Expected End:  07/20/24         Annette Bowers will participate in developing a concrete plan for increasing social contacts to form a social support network     Start:  01/21/24         Teach Annette coping strategies (e.g., writing down thoughts and feelings in a journal; taking deep, slow  breaths; calling a support person to talk about memories) to deal with trauma memories and sudden emotional reactions without becoming emotionally n     Start:  01/21/24         Educate Annette on trauma influenced cognitive distortions     Start:  01/21/24           OP Depression     LTG: Reduce frequency, intensity, and duration of depression symptoms so that daily functioning is improved     Start:  01/21/24    Expected End:  07/20/24         Demonstrates progress in stages of grief at own pace     Start:  01/21/24    Expected End:  07/20/24            Therapist will educate patient on cognitive distortions and the rationale for treatment of depression     Start:  01/21/24         [name] will identify 2 cognitive distortions they are currently using and write reframing statements  to replace them     Start:  01/21/24         Coping Skills      Start:  01/21/24       Will work with the pt using CBT/DBT techniques to help the pt verbalize an understanding of the cognitive, physiological, and behavioral components of depression and its treatment. This will be done by using worksheets, interactive activities, CBT/ABC thought logs, modeling, homework, role playing and journaling. Will work with pt to learn and implement coping skills that result in a reduction of depression and improve daily functioning per pt self report 3 out of 5 documented sessions.        Educate patient on: Stages of grief     Start:  01/21/24                ProgressTowards Goals: Progressing  Interventions: Supportive and Other: EMDR  Summary: Annette Bowers is a 38 y.o. female who presents with Hopelessness, Sleep (too much or little), Irritability, Worthlessness, Difficulty concentrating, Fatigue, Irritability, Restlessness, Sleep, Tension, and Worrying. Pt was oriented times 5. Pt was cooperative and engaged. Pt denies SI/HI/AVH.   The anniversary of her son's passing is approaching on July 5th. She discussed how she and her boyfriend remember their son and explored feelings of confusion and resentment, as she feels that others pressure them to engage in certain experiences. She shared what she feels she needs but also expressed a fear of isolation. She feels the pressure to be okay and has a desire to find happiness while remembering the time they shared together.   She reflected on the grief of not being able to seek support from her mother. Additionally, she addressed the factors within her control regarding how she needs to grieve, exploring how she can establish boundaries to facilitate that process.  She shared her intentions for healing and redefining happiness.   She also reported ongoing uncertainty related to her partner's job loss and the loss of insurance due to his  health needs. She admitted to coping by wearing a mask around others to make them feel comfortable, recognizing that she has been living for other people. Despite the challenges, she reports feeling that there is a light at the end of the tunnel, although she acknowledges it is a long journey.  Patient completed an activity to review the positive reframed cognitions she needs to surround herself with identifying sh will  hang these messages around her home.   Suicidal/Homicidal: Nowithout intent/plan  Therapist Response: Clinician utilized active and supportive reflection to create a safe space for patient to process recent life events. Clinician assessed for current stressors, symptoms, safety since last session. Cln continued to address patient grief through reflection of her progress with grieving and ways she feels she needs to establish boundaries to offer a safe space for herself to grieve that way she feel needs.   Plan: Reports due to financial strain, she has to spread out her therapy appointments.   Diagnosis: Persistent complex bereavement disorder  Moderate episode of recurrent major depressive disorder (HCC)  GAD (generalized anxiety disorder)   Collaboration of Care: AEB psychiatrist can access notes and cln. Will review psychiatrists' notes. Check in with the patient and will see LCSW per availability. Patient agreed with treatment recommendations.   Patient/Guardian was advised Release of Information must be obtained prior to any record release in order to collaborate their care with an outside provider. Patient/Guardian was advised if they have not already done so to contact the registration department to sign all necessary forms in order for us  to release information regarding their care.   Consent: Patient/Guardian gives verbal consent for treatment and assignment of benefits for services provided during this visit. Patient/Guardian expressed understanding and agreed to  proceed.   Annette KATHEE Husband, LCSW 06/04/2024

## 2024-06-15 ENCOUNTER — Ambulatory Visit: Admitting: Licensed Clinical Social Worker

## 2024-06-18 ENCOUNTER — Telehealth (INDEPENDENT_AMBULATORY_CARE_PROVIDER_SITE_OTHER): Admitting: Psychiatry

## 2024-06-18 ENCOUNTER — Encounter: Payer: Self-pay | Admitting: Psychiatry

## 2024-06-18 DIAGNOSIS — F4381 Prolonged grief disorder: Secondary | ICD-10-CM

## 2024-06-18 DIAGNOSIS — F411 Generalized anxiety disorder: Secondary | ICD-10-CM

## 2024-06-18 DIAGNOSIS — F3341 Major depressive disorder, recurrent, in partial remission: Secondary | ICD-10-CM | POA: Diagnosis not present

## 2024-06-18 MED ORDER — MIRTAZAPINE 7.5 MG PO TABS
7.5000 mg | ORAL_TABLET | Freq: Every day | ORAL | 1 refills | Status: DC
Start: 2024-06-18 — End: 2024-09-03

## 2024-06-18 NOTE — Progress Notes (Signed)
 Virtual Visit via Video Note  I connected with Annette Bowers Friend on 06/18/24 at  9:00 AM EDT by a video enabled telemedicine application and verified that I am speaking with the correct person using two identifiers.  Location Provider Location : [facility] Patient Location : Home  Participants: Patient , Provider    I discussed the limitations of evaluation and management by telemedicine and the availability of in person appointments. The patient expressed understanding and agreed to proceed.  I discussed the assessment and treatment plan with the patient. The patient was provided an opportunity to ask questions and all were answered. The patient agreed with the plan and demonstrated an understanding of the instructions.   The patient was advised to call back or seek an in-person evaluation if the symptoms worsen or if the condition fails to improve as anticipated.   BH MD OP Progress Note  06/18/2024 9:30 AM YOLAND SCHERR  MRN:  [id number]  Chief Complaint:  Chief Complaint  Patient presents with   Follow-up   Depression   Anxiety   Medication Refill    Discussed the use of AI scribe software for clinical note transcription with the patient, who gave verbal consent to proceed.  History of Present Illness Annette Bowers is a 38 year old Caucasian female, employed, engaged, lives in [place], has a history of MDD, GAD, persistent complex bereavement disorder, migraine headaches, was evaluated by telemedicine today.  She is experiencing increased stress and anxiety due to her partner losing his job, leading to financial difficulties and concerns about maintaining access to necessary medications. This situation has been ongoing for a couple of weeks, and she describes it as 'difficult' and 'different'.  She is currently taking sertraline  200 mg, lamotrigine  100 mg, mirtazapine  7.5 mg, and hydroxyzine  25 mg two to three times a day as needed. No changes have been made to her  medication regimen since her last visit in May.  She reports difficulty sleeping, getting about five to six hours of sleep per night. She wakes up during the night and takes about an hour to fall back asleep. She avoids using electronic devices during these times to help with sleep.  She continues to follow up with her therapist, although she has had to space out her appointments due to current circumstances. She is using coping strategies learned in therapy to manage her symptoms.  Denies thoughts of self-harm or harm to others. She is managing her work well and finds it to be a good distraction.   Visit Diagnosis:    ICD-10-CM   1. Recurrent major depressive disorder, in partial remission (HCC)  F33.41 mirtazapine  (REMERON ) 7.5 MG tablet    2. GAD (generalized anxiety disorder)  F41.1 mirtazapine  (REMERON ) 7.5 MG tablet    3. Persistent complex bereavement disorder  F43.81 mirtazapine  (REMERON ) 7.5 MG tablet   Other specified trauma and stress related disorder      Past Psychiatric History: I have reviewed past psychiatric history from progress note on 11/05/2023.  Past trials of trazodone .  Past Medical History:  Past Medical History:  Diagnosis Date   Allergy    Anemia    Anxiety    Encounter for screening for HIV 09/19/2022   Insomnia    Migraine    Moderate major depression (HCC)     Past Surgical History:  Procedure Laterality Date   WISDOM TOOTH EXTRACTION  2011    Family Psychiatric History: I have reviewed family psychiatric history from progress note on  11/05/2023.  Family History:  Family History  Problem Relation Age of Onset   Hyperlipidemia Mother    Hypertension Mother    Diabetes Father    Hypertension Father    Hypertension Maternal Uncle    Diabetes Maternal Grandfather    Diabetes Maternal Grandmother    Heart disease Maternal Grandmother    Hypertension Maternal Grandmother    Arthritis Maternal Grandmother    Kidney failure Maternal  Grandmother    Lung cancer Paternal Grandmother    Cancer Paternal Grandmother    Multiple sclerosis Cousin    Mental illness Neg Hx     Social History: I have reviewed social history from progress note on 11/05/2023. Social History   Socioeconomic History   Marital status: Significant Other    Spouse name: Not on file   Number of children: 1   Years of education: Not on file   Highest education level: Associate degree: occupational, Scientist, product/process development, or vocational program  Occupational History    Employer: [employer]  Tobacco Use   Smoking status: Former    Current packs/day: 0.50    Average packs/day: 0.5 packs/day for 8.0 years (4.0 ttl pk-yrs)    Types: Cigarettes   Smokeless tobacco: Current   Tobacco comments:    Vaping daily  Vaping Use   Vaping status: Every Day  Substance and Sexual Activity   Alcohol use: Yes    Alcohol/week: 3.0 - 4.0 standard drinks of alcohol    Types: 3 - 4 Glasses of wine per week   Drug use: No   Sexual activity: Yes    Birth control/protection: None  Other Topics Concern   Not on file  Social History Narrative   Not on file   Social Drivers of Health   Financial Resource Strain: High Risk (11/11/2023)   Overall Financial Resource Strain (CARDIA)    Difficulty of Paying Living Expenses: Hard  Food Insecurity: Food Insecurity Present (11/11/2023)   Hunger Vital Sign    Worried About Running Out of Food in the Last Year: Sometimes true    Ran Out of Food in the Last Year: Sometimes true  Transportation Needs: Unmet Transportation Needs (11/11/2023)   PRAPARE - Transportation    Lack of Transportation (Medical): No    Lack of Transportation (Non-Medical): Yes  Physical Activity: Sufficiently Active (11/11/2023)   Exercise Vital Sign    Days of Exercise per Week: 5 days    Minutes of Exercise per Session: 30 min  Stress: Stress Concern Present (11/11/2023)   [employer] of Occupational Health - Occupational Stress Questionnaire    Feeling  of Stress : Very much  Social Connections: Moderately Isolated (11/11/2023)   Social Connection and Isolation Panel    Frequency of Communication with Friends and Family: More than three times a week    Frequency of Social Gatherings with Friends and Family: Once a week    Attends Religious Services: 1 to 4 times per year    Active Member of [employer] or Organizations: No    Attends Engineer, structural: Not on file    Marital Status: Divorced    Allergies:  Allergies  Allergen Reactions   Promethazine Hcl     drowsy   Topiramate     drowsy    Metabolic Disorder Labs: Lab Results  Component Value Date   HGBA1C 5.1 03/18/2024   No results found for: PROLACTIN Lab Results  Component Value Date   CHOL 202 (H) 03/18/2024   TRIG 114 03/18/2024  HDL 64 03/18/2024   CHOLHDL 3.2 03/18/2024   LDLCALC 118 (H) 03/18/2024   LDLCALC 117 (H) 11/11/2023   Lab Results  Component Value Date   TSH 0.608 03/18/2024   TSH 1.580 11/11/2023    Therapeutic Level Labs: No results found for: LITHIUM No results found for: VALPROATE No results found for: CBMZ  Current Medications: Current Outpatient Medications  Medication Sig Dispense Refill   ALPRAZolam  (XANAX ) 0.25 MG tablet TAKE 1 TABLET BY MOUTH ONCE DAILY AS NEEDED FOR ANXIETY. CONTINUE HYDROXYZINE  TO ASSIST WITH WEANING AS DIRECTED. 30 tablet 0   hydrOXYzine  (ATARAX ) 25 MG tablet Take 0.5-1 tablets (12.5-25 mg total) by mouth 3 (three) times daily as needed for anxiety. 90 tablet 1   lamoTRIgine  (LAMICTAL ) 100 MG tablet Take 1 tablet (100 mg total) by mouth daily. 90 tablet 1   mirtazapine  (REMERON ) 7.5 MG tablet Take 1 tablet (7.5 mg total) by mouth at bedtime. 30 tablet 1   sertraline  (ZOLOFT ) 100 MG tablet Take 2 tablets (200 mg total) by mouth daily. 180 tablet 1   Vitamin D , Ergocalciferol , (DRISDOL ) 1.25 MG (50000 UNIT) CAPS capsule Take 1 capsule (50,000 Units total) by mouth every 7 (seven) days. 26 capsule 1    No current facility-administered medications for this visit.     Musculoskeletal: Strength & Muscle Tone: UTA Gait & Station: Seated Patient leans: N/A  Psychiatric Specialty Exam: Review of Systems  Psychiatric/Behavioral:  Positive for dysphoric mood and sleep disturbance. The patient is nervous/anxious.     There were no vitals taken for this visit.There is no height or weight on file to calculate BMI.  General Appearance: Casual  Eye Contact:  Fair  Speech:  Normal Rate  Volume:  Normal  Mood:  Anxious and Depressed  Affect:  Congruent  Thought Process:  Goal Directed and Descriptions of Associations: Intact  Orientation:  Full (Time, Place, and Person)  Thought Content: Logical   Suicidal Thoughts:  No  Homicidal Thoughts:  No  Memory:  Immediate;   Fair Recent;   Fair Remote;   Good  Judgement:  Fair  Insight:  Fair  Psychomotor Activity:  Normal  Concentration:  Concentration: Fair and Attention Span: Fair  Recall:  [employer] of Knowledge: Fair  Language: Fair  Akathisia:  No  Handed:  Right  AIMS (if indicated): not done  Assets:  Communication Skills Desire for Improvement Housing Social Support Transportation  ADL's:  Intact  Cognition: WNL  Sleep:  Poor   Screenings: AUDIT    Loss adjuster, chartered Office Visit from 11/11/2023 in [facility]  Alcohol Use Disorder Identification Test Final Score (AUDIT) 3    GAD-7    Flowsheet Row Office Visit from 03/18/2024 in [facility] Counselor from 01/21/2024 in [facility] Office Visit from 11/11/2023 in [facility] Office Visit from 11/05/2023 in [facility] Video Visit from 03/27/2021 in [facility]  Total GAD-7 Score 19 17 16 19 18    PHQ2-9    Flowsheet Row Office Visit from 03/18/2024 in [facility]  Counselor from 01/21/2024 in [facility] Office Visit from 11/11/2023 in [facility] Office Visit from 11/05/2023 in [facility] Telephone from 03/19/2023 in [facility]  PHQ-2 Total Score 6 4 5 6 4   PHQ-9 Total Score 17  14 14 22 15    Flowsheet Row Video Visit from 06/18/2024 in [facility] Video Visit from 04/16/2024 in [facility] Video Visit from 03/15/2024 in [facility]  C-SSRS RISK CATEGORY No Risk No Risk No Risk     Assessment and Plan: Annette Bowers is a 38 year old Caucasian female who has a history of MDD, GAD was evaluated by telemedicine today.  Discussed assessment and plan as noted below.  Major depressive disorder in partial remission Currently struggling with situational stressors, financial constraints which does affect her mood.  Not interested in medication changes at this time.  Would like to continue psychotherapy sessions as scheduled. Continue Sertraline  200 mg daily Continue Lamotrigine  100 mg daily Continue psychotherapy sessions with Ms. Evalene Husband. Patient to send out my chart communication if interested in increasing the dosage of Lamotrigine .  GAD-unstable Currently worried about her situational/financial problems.  Would like to continue psychotherapy sessions at this time. Continue Sertraline  200 mg daily Continue Hydroxyzine  25 mg up to 2-3 times a day for severe anxiety symptoms. Encouraged to continue CBT  Other specified trauma and stress related disorder-persistent bereavement disorder-improving Currently does have anxiety/sleep problems mostly related to current situational stressors.  Otherwise coping with her grief better than before.  Continues to be motivated to stay in  therapy Discussed readjusting the dosage of mirtazapine , currently not interested. Continue Mirtazapine  7.5 mg at bedtime Continue Hydroxyzine  25 mg 2-3 times a day as needed Encouraged to work on sleep hygiene techniques. Patient is in my chart communication if interested in readjusting the dosage of mirtazapine  to address sleep and anxiety  Follow-up Follow-up in clinic in 2 to 3 months or sooner if needed.   Collaboration of Care: Collaboration of Care: Referral or follow-up with counselor/therapist AEB encouraged to continue psychotherapy sessions.  Patient/Guardian was advised Release of Information must be obtained prior to any record release in order to collaborate their care with an outside provider. Patient/Guardian was advised if they have not already done so to contact the registration department to sign all necessary forms in order for us  to release information regarding their care.   Consent: Patient/Guardian gives verbal consent for treatment and assignment of benefits for services provided during this visit. Patient/Guardian expressed understanding and agreed to proceed.   This note was generated in part or whole with voice recognition software. Voice recognition is usually quite accurate but there are transcription errors that can and very often do occur. I apologize for any typographical errors that were not detected and corrected.    Hosanna Betley, MD 06/18/2024, 9:30 AM

## 2024-06-28 ENCOUNTER — Other Ambulatory Visit: Payer: Self-pay | Admitting: Family Medicine

## 2024-06-28 DIAGNOSIS — F419 Anxiety disorder, unspecified: Secondary | ICD-10-CM

## 2024-07-02 ENCOUNTER — Ambulatory Visit: Admitting: Licensed Clinical Social Worker

## 2024-07-12 ENCOUNTER — Ambulatory Visit: Admitting: Licensed Clinical Social Worker

## 2024-07-29 ENCOUNTER — Ambulatory Visit (INDEPENDENT_AMBULATORY_CARE_PROVIDER_SITE_OTHER): Admitting: Licensed Clinical Social Worker

## 2024-07-29 DIAGNOSIS — F4381 Prolonged grief disorder: Secondary | ICD-10-CM

## 2024-07-29 DIAGNOSIS — F411 Generalized anxiety disorder: Secondary | ICD-10-CM

## 2024-07-29 DIAGNOSIS — F331 Major depressive disorder, recurrent, moderate: Secondary | ICD-10-CM
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
## 2024-08-01 ENCOUNTER — Other Ambulatory Visit: Payer: Self-pay | Admitting: Family Medicine

## 2024-08-01 DIAGNOSIS — F419 Anxiety disorder, unspecified: Secondary | ICD-10-CM

## 2024-08-03 NOTE — Telephone Encounter (Signed)
 Requested medication (s) are due for refill today: yes  Requested medication (s) are on the active medication list: yes  Last refill:  06/30/24 #30  Future visit scheduled: yes  Notes to clinic:  med not delegated to NT to RF   Requested Prescriptions  Pending Prescriptions Disp Refills   ALPRAZolam  (XANAX ) 0.25 MG tablet [Pharmacy Med Name: ALPRAZOLAM  0.25MG  TABLETS] 30 tablet     Sig: TAKE 1 TABLET BY MOUTH DAILY AS NEEDED FOR ANXIETY. CONTINUE HYDROXYZINE  TO ASSIST WITH WEANING AS DIRECTED     Not Delegated - Psychiatry: Anxiolytics/Hypnotics 2 Failed - 08/03/2024 11:15 AM      Failed - This refill cannot be delegated      Failed - Urine Drug Screen completed in last 360 days      Passed - Patient is not pregnant      Passed - Valid encounter within last 6 months    Recent Outpatient Visits           4 months ago Annual physical exam   [facility] Simmons-Robinson, Rockie, MD       Future Appointments             In 1 month Simmons-Robinson, Rockie, MD [facility]

## 2024-08-12 ENCOUNTER — Ambulatory Visit: Admitting: Licensed Clinical Social Worker

## 2024-08-23 ENCOUNTER — Ambulatory Visit (INDEPENDENT_AMBULATORY_CARE_PROVIDER_SITE_OTHER): Admitting: Licensed Clinical Social Worker

## 2024-08-23 DIAGNOSIS — F331 Major depressive disorder, recurrent, moderate: Secondary | ICD-10-CM | POA: Diagnosis not present

## 2024-08-23 DIAGNOSIS — F411 Generalized anxiety disorder: Secondary | ICD-10-CM | POA: Diagnosis not present

## 2024-08-23 DIAGNOSIS — F4381 Prolonged grief disorder: Secondary | ICD-10-CM | POA: Diagnosis not present
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
## 2024-09-03 ENCOUNTER — Telehealth: Admitting: Psychiatry

## 2024-09-03 DIAGNOSIS — F4381 Prolonged grief disorder: Secondary | ICD-10-CM | POA: Diagnosis not present

## 2024-09-03 DIAGNOSIS — F411 Generalized anxiety disorder: Secondary | ICD-10-CM | POA: Diagnosis not present

## 2024-09-03 DIAGNOSIS — F3341 Major depressive disorder, recurrent, in partial remission: Secondary | ICD-10-CM

## 2024-09-03 MED ORDER — MIRTAZAPINE 7.5 MG PO TABS: 7.5000 mg | ORAL_TABLET | Freq: Every day | ORAL | 2 refills | Status: AC

## 2024-09-03 NOTE — Progress Notes (Signed)
 Virtual Visit via Video Note  I connected with Annette Bowers on 09/03/24 at  9:00 AM EDT by a video enabled telemedicine application and verified that I am speaking with the correct person using two identifiers.  Location Provider Location : [facility] Patient Location : Home  Participants: Patient , Provider    I discussed the limitations of evaluation and management by telemedicine and the availability of in person appointments. The patient expressed understanding and agreed to proceed.   I discussed the assessment and treatment plan with the patient. The patient was provided an opportunity to ask questions and all were answered. The patient agreed with the plan and demonstrated an understanding of the instructions.   The patient was advised to call back or seek an in-person evaluation if the symptoms worsen or if the condition fails to improve as anticipated.   BH MD OP Progress Note  09/03/2024 9:20 AM Annette Bowers  MRN:  [id number]  Chief Complaint:  Chief Complaint  Patient presents with   Follow-up   Anxiety   Depression   Medication Refill   Discussed the use of AI scribe software for clinical note transcription with the patient, who gave verbal consent to proceed.  History of Present Illness Annette Bowers is a 38 year old Caucasian female, engaged, lives in [place] has a history of MDD, GAD, persistent complex bereavement disorder, migraine headaches was evaluated by telemedicine today.  She reports ongoing depression and anxiety, describing persistent emotional challenges and difficulty coping with situational stressors at home. She remains concerned about fatigue, and she continues to manage symptoms on a day-to-day basis. She has experienced a significant decrease in interest in social activities, and she notes that she no longer enjoys going places or being around people, which represents a change from her previous functioning. Unresolved grief continues to  affect her significantly, and she states that she sometimes needs to distance herself from situations, such as leaving church due to emotional distress. She describes therapy sessions as emotional but helpful, and she works with her therapist to address these issues. She now attends therapy once a month instead of every other week due to financial constraints. At home, she uses coping strategies and tries to keep herself occupied, setting limits for self-care when possible.  Her appetite remains pretty good, and she makes efforts to maintain adequate nutrition while managing financial challenges.  She continues to take sertraline  and lamotrigine  for depression, and hydroxyzine  25 mg as needed for anxiety. She also takes mirtazapine  7.5 mg at bedtime when available, but notes that at times she could not obtain it due to cost. She reports that her sleep quality declines when she does not have mirtazapine , and she sometimes uses trazodone  as an alternative. She denies experiencing any side effects from her medications. She also takes vitamin D  when she can afford it.  She is currently employed and working from home with the option for some overtime.    Visit Diagnosis:    ICD-10-CM   1. Recurrent major depressive disorder, in partial remission  F33.41 mirtazapine  (REMERON ) 7.5 MG tablet    2. GAD (generalized anxiety disorder)  F41.1 mirtazapine  (REMERON ) 7.5 MG tablet    3. Persistent complex bereavement disorder  F43.81 mirtazapine  (REMERON ) 7.5 MG tablet   Other specified trauma and stress related disorder      Past Psychiatric History: I have reviewed past psychiatric history from progress note on 11/05/2023.  Past trials of trazodone .  Past Medical History:  Past Medical  History:  Diagnosis Date   Allergy    Anemia    Anxiety    Encounter for screening for HIV 09/19/2022   Insomnia    Migraine    Moderate major depression (HCC)     Past Surgical History:  Procedure Laterality Date    WISDOM TOOTH EXTRACTION  2011    Family Psychiatric History: I have reviewed family psychiatric history from progress note on 11/05/2023.  Family History:  Family History  Problem Relation Age of Onset   Hyperlipidemia Mother    Hypertension Mother    Diabetes Father    Hypertension Father    Hypertension Maternal Uncle    Diabetes Maternal Grandfather    Diabetes Maternal Grandmother    Heart disease Maternal Grandmother    Hypertension Maternal Grandmother    Arthritis Maternal Grandmother    Kidney failure Maternal Grandmother    Lung cancer Paternal Grandmother    Cancer Paternal Grandmother    Multiple sclerosis Cousin    Mental illness Neg Hx     Social History: I have reviewed social history from progress note on 11/05/2023. Social History   Socioeconomic History   Marital status: Significant Other    Spouse name: Not on file   Number of children: 1   Years of education: Not on file   Highest education level: Associate degree: occupational, Scientist, product/process development, or vocational program  Occupational History    Employer: [employer]  Tobacco Use   Smoking status: Former    Current packs/day: 0.50    Average packs/day: 0.5 packs/day for 8.0 years (4.0 ttl pk-yrs)    Types: Cigarettes   Smokeless tobacco: Current   Tobacco comments:    Vaping daily  Vaping Use   Vaping status: Every Day  Substance and Sexual Activity   Alcohol use: Yes    Alcohol/week: 3.0 - 4.0 standard drinks of alcohol    Types: 3 - 4 Glasses of wine per week   Drug use: No   Sexual activity: Yes    Birth control/protection: None  Other Topics Concern   Not on file  Social History Narrative   Not on file   Social Drivers of Health   Financial Resource Strain: High Risk (11/11/2023)   Overall Financial Resource Strain (CARDIA)    Difficulty of Paying Living Expenses: Hard  Food Insecurity: Food Insecurity Present (11/11/2023)   Hunger Vital Sign    Worried About Running Out of Food in the Last  Year: Sometimes true    Ran Out of Food in the Last Year: Sometimes true  Transportation Needs: Unmet Transportation Needs (11/11/2023)   PRAPARE - Transportation    Lack of Transportation (Medical): No    Lack of Transportation (Non-Medical): Yes  Physical Activity: Sufficiently Active (11/11/2023)   Exercise Vital Sign    Days of Exercise per Week: 5 days    Minutes of Exercise per Session: 30 min  Stress: Stress Concern Present (11/11/2023)   [employer] of Occupational Health - Occupational Stress Questionnaire    Feeling of Stress : Very much  Social Connections: Moderately Isolated (11/11/2023)   Social Connection and Isolation Panel    Frequency of Communication with Friends and Family: More than three times a week    Frequency of Social Gatherings with Friends and Family: Once a week    Attends Religious Services: 1 to 4 times per year    Active Member of [employer] or Organizations: No    Attends Banker Meetings: Not on file  Marital Status: Divorced    Allergies:  Allergies  Allergen Reactions   Promethazine Hcl     drowsy   Topiramate     drowsy    Metabolic Disorder Labs: Lab Results  Component Value Date   HGBA1C 5.1 03/18/2024   No results found for: PROLACTIN Lab Results  Component Value Date   CHOL 202 (H) 03/18/2024   TRIG 114 03/18/2024   HDL 64 03/18/2024   CHOLHDL 3.2 03/18/2024   LDLCALC 118 (H) 03/18/2024   LDLCALC 117 (H) 11/11/2023   Lab Results  Component Value Date   TSH 0.608 03/18/2024   TSH 1.580 11/11/2023    Therapeutic Level Labs: No results found for: LITHIUM No results found for: VALPROATE No results found for: CBMZ  Current Medications: Current Outpatient Medications  Medication Sig Dispense Refill   ALPRAZolam  (XANAX ) 0.25 MG tablet TAKE 1 TABLET BY MOUTH DAILY AS NEEDED FOR ANXIETY. CONTINUE HYDROXYZINE  TO ASSIST WITH WEANING AS DIRECTED 30 tablet 2   hydrOXYzine  (ATARAX ) 25 MG tablet Take 0.5-1  tablets (12.5-25 mg total) by mouth 3 (three) times daily as needed for anxiety. 90 tablet 1   lamoTRIgine  (LAMICTAL ) 100 MG tablet Take 1 tablet (100 mg total) by mouth daily. 90 tablet 1   mirtazapine  (REMERON ) 7.5 MG tablet Take 1 tablet (7.5 mg total) by mouth at bedtime. 30 tablet 2   sertraline  (ZOLOFT ) 100 MG tablet Take 2 tablets (200 mg total) by mouth daily. 180 tablet 1   Vitamin D , Ergocalciferol , (DRISDOL ) 1.25 MG (50000 UNIT) CAPS capsule Take 1 capsule (50,000 Units total) by mouth every 7 (seven) days. 26 capsule 1   No current facility-administered medications for this visit.     Musculoskeletal: Strength & Muscle Tone: UTA Gait & Station: Seated Patient leans: N/A  Psychiatric Specialty Exam: Review of Systems  Psychiatric/Behavioral:  Positive for dysphoric mood and sleep disturbance. The patient is nervous/anxious.     There were no vitals taken for this visit.There is no height or weight on file to calculate BMI.  General Appearance: Casual  Eye Contact:  Fair  Speech:  Clear and Coherent  Volume:  Normal  Mood:  Anxious and Depressed  Affect:  Congruent  Thought Process:  Goal Directed and Descriptions of Associations: Intact  Orientation:  Full (Time, Place, and Person)  Thought Content: Logical   Suicidal Thoughts:  No  Homicidal Thoughts:  No  Memory:  Immediate;   Fair Recent;   Fair Remote;   Fair  Judgement:  Fair  Insight:  Fair  Psychomotor Activity:  Normal  Concentration:  Concentration: Fair and Attention Span: Fair  Recall:  [employer] of Knowledge: Fair  Language: Fair  Akathisia:  No  Handed:  Right  AIMS (if indicated): not done  Assets:  Communication Skills Desire for Improvement Housing Social Support  ADL's:  Intact  Cognition: WNL  Sleep:  varies   Screenings: AUDIT    Loss adjuster, chartered Office Visit from 11/11/2023 in [facility]  Alcohol Use Disorder Identification Test Final Score (AUDIT) 3     GAD-7    Flowsheet Row Counselor from 07/29/2024 in [facility] Office Visit from 03/18/2024 in [facility] Counselor from 01/21/2024 in [facility] Office Visit from 11/11/2023 in [facility] Office Visit from 11/05/2023 in [facility]  Total GAD-7 Score 9 19 17 16  19  [id number]    Flowsheet Row Counselor from 07/29/2024 in [facility] Office Visit from 03/18/2024 in [facility] Counselor from 01/21/2024 in [facility] Office Visit from 11/11/2023 in [facility] Office Visit from 11/05/2023 in [facility]  PHQ-2 Total Score 4 6 4 5 6   PHQ-9 Total Score 8 17 14 14 22    Flowsheet Row Video Visit from 09/03/2024 in [facility] Video Visit from 06/18/2024 in [facility] Video Visit from 04/16/2024 in [facility]  C-SSRS RISK CATEGORY No Risk No Risk No Risk     Assessment and Plan: ROSINA Bowers is a 38 year old Caucasian female with history of depression, anxiety presented for a follow-up appointment, discussed assessment and plan as noted below.   Assessment & Plan Major depressive disorder in partial remission Generalized anxiety disorder-unstable Other specified trauma and stress related disorder-improving She continues to experience major depressive disorder and anxiety disorder, exacerbated by situational challenges and reduced therapy sessions due to financial constraints. Anhedonia and social withdrawal are noted, differing from her previous behavior. Current medications include sertraline , Lamictal , hydroxyzine  as needed, and  mirtazapine  7.5 mg at bedtime. No side effects are reported, but financial issues affect mirtazapine  availability, leading to trazodone  use with suboptimal sleep quality. She denies thoughts of self-harm or harm to others.  Continue Sertraline  200 mg daily. Continue Lamotrigine  100 mg daily Continue Hydroxyzine  25 mg up to 2-3 times a day for severe anxiety . Continue Mirtazapine  7.5 mg at bedtime.  (Encouraged to restart Mirtazapine ) Encourage continuation of therapy with Ms.Kristina Perkins and practicing coping strategies at home.  She has ongoing vitamin D  deficiency, with previous levels at 6.8 in 2023, showing improvement but now declining. Limited sun exposure and inconsistent supplementation are due to financial constraints. Encourage vitamin D  supplementation, daily sun exposure to improve levels. Recommend testing vitamin D  levels after supplementation to ensure adequacy.  Follow-up Follow-up in clinic in 8 to 10 weeks or sooner if needed.  Collaboration of Care: Collaboration of Care: Referral or follow-up with counselor/therapist AEB encouraged to continue psychotherapy sessions.  Patient/Guardian was advised Release of Information must be obtained prior to any record release in order to collaborate their care with an outside provider. Patient/Guardian was advised if they have not already done so to contact the registration department to sign all necessary forms in order for us  to release information regarding their care.   Consent: Patient/Guardian gives verbal consent for treatment and assignment of benefits for services provided during this visit. Patient/Guardian expressed understanding and agreed to proceed.   This note was generated in part or whole with voice recognition software. Voice recognition is usually quite accurate but there are transcription errors that can and very often do occur. I apologize for any typographical errors that were not detected and  corrected.    Gayathri Futrell, MD 09/05/2024, 6:37 AM

## 2024-09-05 ENCOUNTER — Encounter: Payer: Self-pay | Admitting: Psychiatry

## 2024-09-20 ENCOUNTER — Ambulatory Visit: Admitting: Licensed Clinical Social Worker

## 2024-09-21 ENCOUNTER — Ambulatory Visit: Admitting: Family Medicine

## 2024-09-21 ENCOUNTER — Encounter: Payer: Self-pay | Admitting: Family Medicine

## 2024-09-21 VITALS — BP 110/78 | HR 89 | Temp 98.4°F | Ht 61.0 in | Wt 121.7 lb

## 2024-09-21 DIAGNOSIS — E559 Vitamin D deficiency, unspecified: Secondary | ICD-10-CM

## 2024-09-21 DIAGNOSIS — G4709 Other insomnia: Secondary | ICD-10-CM

## 2024-09-21 DIAGNOSIS — F411 Generalized anxiety disorder: Secondary | ICD-10-CM

## 2024-09-21 DIAGNOSIS — F331 Major depressive disorder, recurrent, moderate: Secondary | ICD-10-CM

## 2024-09-21 DIAGNOSIS — Z599 Problem related to housing and economic circumstances, unspecified: Secondary | ICD-10-CM

## 2024-09-21 DIAGNOSIS — Z59868 Other specified financial insecurity: Secondary | ICD-10-CM

## 2024-09-21 NOTE — Patient Instructions (Signed)
 To keep you healthy, please keep in mind the following health maintenance items that you are due for:   Health Maintenance Due  Topic Date Due   HPV VACCINES (1 - 3-dose SCDM series) Never done   Cervical Cancer Screening (HPV/Pap Cotest)  Never done     Best Wishes,   Dr. Lang

## 2024-09-21 NOTE — Progress Notes (Signed)
 Established patient visit   Patient: Annette Bowers   DOB: [birth year]   38 y.o. Female  MRN: [id number] Visit Date: 09/21/2024  Today's healthcare provider: Rockie Agent, MD   Chief Complaint  Patient presents with   Medical Management of Chronic Issues   Anxiety   Depression   Insomnia    Patient notes some improvement in sleep   Subjective     HPI     Insomnia    Additional comments: Patient notes some improvement in sleep      Last edited by Cherry Chiquita HERO, CMA on 09/21/2024  1:26 PM.       Discussed the use of AI scribe software for clinical note transcription with the patient, who gave verbal consent to proceed.  History of Present Illness Annette Bowers is a 38 year old female with anxiety, depression, and insomnia who presents for a chronic six month follow-up.  She has generalized anxiety with a GAD-7 score of 18. Her current medication regimen includes Xanax  0.25 mg as needed for anxiety, Atarax  12.5 to 25 mg three times a day as needed for anxiety, and Zoloft  200 mg daily for anxiety and depression. She follows with a behavioral health specialist and continues to manage her medications with her psychiatrist. She uses Xanax  as needed for anxiety, sometimes every other day, depending on the week.  She experiences chronic depression with a PHQ-9 score of 17. She is on Zoloft  200 mg daily and lamotrigine  100 mg daily. She continues to follow with a behavioral health specialist for management.  She has chronic insomnia with some improvement in her sleep. She is on mirtazapine  7.5 mg at bedtime for sleep. Financial difficulties affect her ability to consistently obtain medications, including mirtazapine .  She has a history of vitamin D  deficiency and was previously prescribed high dose vitamin D  50,000 units weekly. Her last vitamin D  level six months ago was 15.4. Financial constraints due to her fianc's job loss have affected her ability to afford  medications, including vitamin D .  She is experiencing financial difficulties due to her fianc's job loss, which affects her ability to afford medications.     Past Medical History:  Diagnosis Date   Allergy    Anemia    Anxiety    Encounter for screening for HIV 09/19/2022   Insomnia    Migraine    Moderate major depression (HCC)     Medications: Outpatient Medications Prior to Visit  Medication Sig   ALPRAZolam  (XANAX ) 0.25 MG tablet TAKE 1 TABLET BY MOUTH DAILY AS NEEDED FOR ANXIETY. CONTINUE HYDROXYZINE  TO ASSIST WITH WEANING AS DIRECTED   hydrOXYzine  (ATARAX ) 25 MG tablet Take 0.5-1 tablets (12.5-25 mg total) by mouth 3 (three) times daily as needed for anxiety.   lamoTRIgine  (LAMICTAL ) 100 MG tablet Take 1 tablet (100 mg total) by mouth daily.   mirtazapine  (REMERON ) 7.5 MG tablet Take 1 tablet (7.5 mg total) by mouth at bedtime.   sertraline  (ZOLOFT ) 100 MG tablet Take 2 tablets (200 mg total) by mouth daily.   Vitamin D , Ergocalciferol , (DRISDOL ) 1.25 MG (50000 UNIT) CAPS capsule Take 1 capsule (50,000 Units total) by mouth every 7 (seven) days.   No facility-administered medications prior to visit.   Flowsheet Row Office Visit from 09/21/2024 in [facility]  PHQ-9 Total Score 17      09/21/2024    1:28 PM 07/29/2024    3:10 PM 03/18/2024    1:54 PM  01/21/2024   11:02 AM  GAD 7 : Generalized Anxiety Score  Nervous, Anxious, on Edge 3  3   Control/stop worrying 3  3   Worry too much - different things 3  3   Trouble relaxing 3  3   Restless 1  2   Easily annoyed or irritable 2  2   Afraid - awful might happen 3  3   Total GAD 7 Score 18  19   Anxiety Difficulty   Somewhat difficult      Information is confidential and restricted. Go to Review Flowsheets to unlock data.      Review of Systems  Last CBC Lab Results  Component Value Date   WBC 12.0 (H) 03/18/2024   HGB 14.0 03/18/2024   HCT 40.8 03/18/2024   MCV 93 03/18/2024    MCH 32.0 03/18/2024   RDW 11.8 03/18/2024   PLT 305 03/18/2024   Last metabolic panel Lab Results  Component Value Date   GLUCOSE 92 11/11/2023   NA 139 11/11/2023   K 3.9 11/11/2023   CL 96 11/11/2023   CO2 27 11/11/2023   BUN 9 11/11/2023   CREATININE 0.79 11/11/2023   EGFR 100 11/11/2023   CALCIUM 9.8 11/11/2023   PROT 7.7 11/11/2023   ALBUMIN 5.0 (H) 11/11/2023   LABGLOB 2.7 11/11/2023   AGRATIO 2.0 03/18/2023   BILITOT 0.3 11/11/2023   ALKPHOS 49 11/11/2023   AST 16 11/11/2023   ALT 13 11/11/2023   ANIONGAP 6 (L) 09/01/2013   Last thyroid functions Lab Results  Component Value Date   TSH 0.608 03/18/2024   Last vitamin D  Lab Results  Component Value Date   VD25OH 14.1 (L) 09/21/2024        Objective    BP 110/78 (Cuff Size: Normal)   Pulse 89   Temp 98.4 F (36.9 C) (Oral)   Ht 5' 1 (1.549 m)   Wt 121 lb 11.2 oz (55.2 kg)   LMP 09/12/2023 (Exact Date)   SpO2 100%   BMI 23.00 kg/m  BP Readings from Last 3 Encounters:  09/21/24 110/78  03/18/24 96/77  11/11/23 115/77   Wt Readings from Last 3 Encounters:  09/21/24 121 lb 11.2 oz (55.2 kg)  03/18/24 111 lb (50.3 kg)  11/11/23 110 lb (49.9 kg)        Physical Exam Vitals reviewed.  Constitutional:      General: She is not in acute distress.    Appearance: Normal appearance. She is not ill-appearing.  Cardiovascular:     Rate and Rhythm: Normal rate and regular rhythm.  Pulmonary:     Effort: Pulmonary effort is normal. No respiratory distress.     Breath sounds: No wheezing, rhonchi or rales.  Neurological:     Mental Status: She is alert and oriented to person, place, and time.  Psychiatric:        Attention and Perception: Attention normal.        Mood and Affect: Mood is anxious.        Speech: Speech normal.        Behavior: Behavior normal. Behavior is cooperative.        Thought Content: Thought content does not include suicidal ideation. Thought content does not include  suicidal plan.     Physical Exam VITALS: BP- 110/78    Results for orders placed or performed in visit on 09/21/24  VITAMIN D  25 Hydroxy (Vit-D Deficiency, Fractures)  Result Value Ref Range  Vit D, 25-Hydroxy 14.1 (L) 30.0 - 100.0 ng/mL    Assessment & Plan     Problem List Items Addressed This Visit     Avitaminosis D   Chronic condition  Vitamin D  deficiency with a previous level of 15.4 ng/mL six months ago. Goal is to increase the level to 30 ng/mL. Financial constraints affect the ability to purchase medications consistently. Discussed using GoodRx for cost-effective options and considering over-the-counter vitamin D  supplementation if high-dose prescription is not affordable. - Recheck vitamin D  levels. - Consider over-the-counter vitamin D  supplementation if high-dose prescription is not affordable. [employer] [employer] GoodRx for cost-effective options for vitamin D .      Relevant Orders   VITAMIN D  25 Hydroxy (Vit-D Deficiency, Fractures) (Completed)   GAD (generalized anxiety disorder)   Generalized anxiety disorder with comorbid depression and insomnia Chronic generalized anxiety disorder with significant symptoms as indicated by a GAD-7 score of 18. Depression is also significant with a PHQ-9 score of 17. Insomnia is present but there is some improvement in sleep. Current medication regimen includes Xanax  0.25 mg as needed for anxiety, Atarax  12.5 to 25 mg three times a day as needed for anxiety, lamotrigine  100 mg daily, Remeron  7.5 mg at bedtime for sleep, and Zoloft  200 mg daily for anxiety and depression. Continues to follow with a behavioral health specialist. - Continue current medication regimen including Xanax , Atarax , lamotrigine , Remeron , and Zoloft . - Continue follow-up with behavioral health specialist.      Insomnia (Chronic)   Moderate episode of recurrent major depressive disorder (HCC) - Primary   Other Visit Diagnoses       Financial difficulty        Relevant Orders   AMB Referral VBCI Care Management     Patient cannot afford medications           Assessment and Plan Assessment & Plan  Elevated blood pressure Chronic condition  Blood pressure recorded at 126/93 mmHg, which is elevated.improved to normal range on recheck  - Recheck blood pressure during the visit.   General Health Maintenance Due for cervical cancer screening and HPV vaccination. Financial difficulties may impact access to healthcare resources. Discussed community resources and social work referral for financial assistance. - Recommend Pap smear for cervical cancer screening within the next four months. - Recommend HPV vaccination. - Provide information on community resources and social work referral for financial assistance.     Return in about 6 months (around 03/22/2025) for CPE, Pap.         Rockie Agent, MD  [facility] [phone] (phone) [phone] (fax)  [facility]

## 2024-09-22 ENCOUNTER — Telehealth: Payer: Self-pay

## 2024-09-22 LAB — VITAMIN D 25 HYDROXY (VIT D DEFICIENCY, FRACTURES): Vit D, 25-Hydroxy: 14.1 ng/mL — ABNORMAL LOW (ref 30.0–100.0)

## 2024-09-22 NOTE — Progress Notes (Signed)
 Care Guide Pharmacy Note  09/22/2024 Name: Annette Bowers MRN: [id number] DOB: [birth year]  Referred By: Sharma Coyer, MD Reason for referral: Complex Care Management and Call Attempt #1 (Unsuccessful initial outreach to schedule with PHARM D and LCSW)   Annette Bowers is a 38 y.o. year old female who is a primary care patient of Simmons-Robinson, Coyer, MD.  Annette Bowers was referred to the pharmacist for assistance related to: Financial difficulties  An unsuccessful telephone outreach was attempted today to contact the patient who was referred to the pharmacy team for assistance with medication assistance. Additional attempts will be made to contact the patient.  Annette Bowers [facility], [facility] Guide  Direct Dial: [phone]  Fax [phone]

## 2024-09-22 NOTE — Progress Notes (Signed)
 Complex Care Management Note Care Guide Note  09/22/2024 Name: Annette Bowers MRN: [id number] DOB: 1986-07-10   Complex Care Management Outreach Attempts: An unsuccessful telephone outreach was attempted today to offer the patient information about available complex care management services.  Follow Up Plan:  Additional outreach attempts will be made to offer the patient complex care management information and services.   Encounter Outcome:  No Answer-Left voicemail  Leotis Rase [facility], [facility] Guide  Direct Dial: [phone]  Fax [phone]

## 2024-09-24 ENCOUNTER — Encounter: Payer: Self-pay | Admitting: Licensed Clinical Social Worker

## 2024-09-24 NOTE — Progress Notes (Signed)
 Complex Care Management Note  Care Guide Note 09/24/2024 Name: Annette Bowers MRN: [id number] DOB: Jan 03, 1986  Annette Bowers is a 38 y.o. year old female who sees Simmons-Robinson, Rockie, MD for primary care. I reached out to [employer] by phone today to offer complex care management services.  [name] was given information about Complex Care Management services today including:   The Complex Care Management services include support from the care team which includes your Nurse Care Manager, Clinical Social Worker, or Pharmacist.  The Complex Care Management team is here to help remove barriers to the health concerns and goals most important to you. Complex Care Management services are voluntary, and the patient may decline or stop services at any time by request to their care team member.   Complex Care Management Consent Status: Patient agreed to services and verbal consent obtained.   Follow up plan:  Telephone appointment with complex care management team member scheduled for:  09/30/24 @ 12 pm  Encounter Outcome:  Patient Scheduled Leotis Rase [facility] Guide  Direct Dial: [phone]  Fax [phone]

## 2024-09-24 NOTE — Progress Notes (Signed)
 Care Guide Pharmacy Note  09/24/2024 Name: [name] MRN: [id number] DOB: 09-04-1986  Referred By: Sharma Coyer, MD Reason for referral: Complex Care Management, Call Attempt #1 (Unsuccessful initial outreach to schedule with PHARM D and LCSW), and Call Attempt #2 (Unsuccessful initial outreach to schedule with Allyson and BSW- Thersia)   Annette Bowers is a 38 y.o. year old female who is a primary care patient of Simmons-Robinson, Coyer, MD.  ABREA HENLE was referred to the pharmacist for assistance related to: Medication assistance  Successful contact was made with the patient to discuss pharmacy services including being ready for the pharmacist to call at least 5 minutes before the scheduled appointment time and to have medication bottles and any blood pressure readings ready for review. The patient agreed to meet with the pharmacist via In office on  on (date/time). 10/14/24 @ 1 PM   Leotis Rase [facility], [facility] Guide  Direct Dial: [phone]  Fax [phone]

## 2024-09-29 ENCOUNTER — Ambulatory Visit: Payer: Self-pay | Admitting: Family Medicine

## 2024-09-29 NOTE — Assessment & Plan Note (Signed)
 Chronic condition  Vitamin D  deficiency with a previous level of 15.4 ng/mL six months ago. Goal is to increase the level to 30 ng/mL. Financial constraints affect the ability to purchase medications consistently. Discussed using GoodRx for cost-effective options and considering over-the-counter vitamin D  supplementation if high-dose prescription is not affordable. - Recheck vitamin D  levels. - Consider over-the-counter vitamin D  supplementation if high-dose prescription is not affordable. [employer] GoodRx for cost-effective options for vitamin D .

## 2024-09-29 NOTE — Assessment & Plan Note (Signed)
 Generalized anxiety disorder with comorbid depression and insomnia Chronic generalized anxiety disorder with significant symptoms as indicated by a GAD-7 score of 18. Depression is also significant with a PHQ-9 score of 17. Insomnia is present but there is some improvement in sleep. Current medication regimen includes Xanax  0.25 mg as needed for anxiety, Atarax  12.5 to 25 mg three times a day as needed for anxiety, lamotrigine  100 mg daily, Remeron  7.5 mg at bedtime for sleep, and Zoloft  200 mg daily for anxiety and depression. Continues to follow with a behavioral health specialist. - Continue current medication regimen including Xanax , Atarax , lamotrigine , Remeron , and Zoloft . - Continue follow-up with behavioral health specialist.

## 2024-09-30 ENCOUNTER — Telehealth: Payer: Self-pay

## 2024-09-30 NOTE — Patient Instructions (Signed)
 Visit Information  Thank you for taking time to visit with me today. Please don't hesitate to contact me if I can be of assistance to you before our next scheduled appointment.  Our next appointment is by telephone on 10/21/24 at 11am Please call the care guide team at [phone] if you need to cancel or reschedule your appointment.   Following is a copy of your care plan:   Goals Addressed             This Visit's Progress    BSW VBCI Social Work Care Plan       Problems:   Air traffic controller Insecurity   CSW Clinical Goal(s):   Over the next 30 days the Patient will work with Child psychotherapist to address concerns related to food, rent, and utitlies.  Interventions:  SW emailed resources to patient for rent, utilities, and food  Patient Goals/Self-Care Activities:  Patient will use resources SW sent.   Plan:   The care management team will reach out to the patient again over the next 15 days.        Please call the Suicide and Crisis Lifeline: 988 call the [place]  National Suicide Prevention Lifeline: (320) 526-1311 or TTY: (819) 365-8338 TTY 2180294473) to talk to a trained counselor call 1-800-273-TALK (toll free, 24 hour hotline) go to [facility] [address] [place] [phone]) call 911 if you are experiencing a Mental Health or Behavioral Health Crisis or need someone to talk to.  Patient verbalized understanding of Care plan and visit instructions communicated this visit  Thersia Hoar, BSW, [facility]  [facility] Social Worker, Population Health [phone]

## 2024-09-30 NOTE — Patient Outreach (Signed)
 Complex Care Management   Visit Note  09/30/2024  Name:  Annette Bowers MRN: [id number] DOB: [birth year]  Situation: Referral received for Complex Care Management related to SDOH Barriers:  Housing rent Food insecurity Lack of essential utilities [employer] Financial Resource Strain I obtained verbal consent from Patient.  Visit completed with Patient  on the phone  Background:   Past Medical History:  Diagnosis Date   Allergy    Anemia    Anxiety    Encounter for screening for HIV 09/19/2022   Insomnia    Migraine    Moderate major depression (HCC)     Assessment: SW completed a telephone outreach with patient, she states she is need of resources for food, rent and utilities. Patient is working. She is not receiving foodstamps. SW encouraged to apply if she can. SW and patient agreed for resources for food, rent and utilities to be sent to email on file.   SDOH Interventions    Flowsheet Row Appointment from 09/30/2024 in [place] [facility] Office Visit from 11/11/2023 in [facility] Telephone from 03/19/2023 in [facility] Office Visit from 03/18/2023 in [facility] Office Visit from 09/19/2022 in [facility] Office Visit from 07/19/2021 in [facility]  SDOH Interventions        Food Insecurity Interventions Community Resources Provided -- Intervention Not Indicated -- -- --  Housing Interventions -- -- Intervention Not Indicated -- -- --  Depression Interventions/Treatment  -- Medication, Referral to Psychiatry Counseling, Medication Medication, Counseling Medication Referral to Psychiatry, Counseling, Currently on Treatment      Recommendation:   No recommendations at this time  Follow Up Plan:   Telephone follow-up in 15 days  Thersia Hoar, HEDWIG, [facility] [place]  [facility] Social  Worker, Population Health [phone]

## 2024-09-30 NOTE — Patient Outreach (Signed)
  error

## 2024-10-12 ENCOUNTER — Telehealth: Payer: Self-pay | Admitting: Family Medicine

## 2024-10-12 NOTE — Telephone Encounter (Signed)
 Copied from CRM [id number]. Topic: Appointments - Scheduling Inquiry for Clinic >> Oct 12, 2024 12:05 PM Emylou G wrote: Reason for CRM: Patient called.... wants to reschedule pharm appt on 11/6 - pls call her to reschedule.. wants to do it couple weeks out.

## 2024-10-13 NOTE — Telephone Encounter (Signed)
 Attempted to contact patient to reschedule appointment with clinical pharmacist. Left voicemail for patient to return call at earliest convenience.  Vidya Bamford E. Marsh, PharmD, CPP Clinical Pharmacist [facility] [phone]

## 2024-10-14 ENCOUNTER — Ambulatory Visit

## 2024-10-18 ENCOUNTER — Ambulatory Visit (INDEPENDENT_AMBULATORY_CARE_PROVIDER_SITE_OTHER): Admitting: Licensed Clinical Social Worker

## 2024-10-18 ENCOUNTER — Telehealth: Payer: Self-pay

## 2024-10-18 DIAGNOSIS — F3341 Major depressive disorder, recurrent, in partial remission: Secondary | ICD-10-CM | POA: Diagnosis not present

## 2024-10-18 DIAGNOSIS — F4381 Prolonged grief disorder: Secondary | ICD-10-CM | POA: Diagnosis not present

## 2024-10-18 DIAGNOSIS — F411 Generalized anxiety disorder: Secondary | ICD-10-CM

## 2024-10-18 NOTE — Progress Notes (Signed)
 Complex Care Management Care Guide Note  10/18/2024 Name: Annette Bowers MRN: [id number] DOB: [birth year]  Annette Bowers is a 38 y.o. year old female who is a primary care patient of Simmons-Robinson, Rockie, MD and is actively engaged with the care management team. I reached out to Annette Bowers by phone today to assist with re-scheduling  with the Pharmacist.  Follow up plan: Unsuccessful telephone outreach attempt made. A HIPAA compliant phone message was left for the patient providing contact information and requesting a return call.  Leotis Rase [facility], [facility] Guide  Direct Dial: [phone]  Fax [phone]
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
## 2024-10-21 ENCOUNTER — Other Ambulatory Visit: Payer: Self-pay | Admitting: Psychiatry

## 2024-10-21 ENCOUNTER — Other Ambulatory Visit: Payer: Self-pay

## 2024-10-21 DIAGNOSIS — F3341 Major depressive disorder, recurrent, in partial remission: Secondary | ICD-10-CM

## 2024-10-21 NOTE — Patient Outreach (Signed)
 Social Drivers of Health  Community Resource and Care Coordination Visit Note   10/21/2024  Name: Annette Bowers MRN: [id number] DOB:02-06-1986  Situation: Referral received for [facility] needs assessment and assistance related to Financial Strain  Food Insecurity . I obtained verbal consent from Patient.  Visit completed with Patient on the phone.   Background:      Assessment:   Goals Addressed             This Visit's Progress    COMPLETED: BSW VBCI Social Work Care Plan       Problems:   [employer] Insecurity   CSW Clinical Goal(s):   Over the next 30 days the Patient will work with Child Psychotherapist to address concerns related to food, rent, and utitlies.  Interventions:  SW emailed resources to patient for rent, utilities, and food  Patient Goals/Self-Care Activities:  Patient will use resources SW sent.   Plan:   The patient has been provided with contact information for the care management team and has been advised to call with any health related questions or concerns.         Recommendation:   Patient will use resources sw sent when needed.   Follow Up Plan:   Patient has achieved all patient stated goals. [employer] will be closed. Patient has been provided contact information should new needs arise.   Thersia Hoar, HEDWIG, MHA [place]  [facility] Social Worker, Population Health [phone]

## 2024-10-21 NOTE — Patient Instructions (Signed)
 Visit Information  Thank you for taking time to visit with me today. Please don't hesitate to contact me if I can be of assistance to you before our next scheduled appointment.  Your next care management appointment is no further scheduled appointments.      Please call the care guide team at [phone] if you need to cancel, schedule, or reschedule an appointment.   Please call the Suicide and Crisis Lifeline: 988 call the [place]  National Suicide Prevention Lifeline: 949-692-7248 or TTY: (207) 720-4383 TTY 938-709-3921) to talk to a trained counselor call 1-800-273-TALK (toll free, 24 hour hotline) go to [facility] [address], [place] [phone]) call 911 if you are experiencing a Mental Health or Behavioral Health Crisis or need someone to talk to.  Thersia Hoar, HEDWIG, MHA [place]  [facility] Social Worker, Population Health [phone]

## 2024-10-27 ENCOUNTER — Other Ambulatory Visit: Payer: Self-pay | Admitting: Psychiatry

## 2024-10-27 DIAGNOSIS — F411 Generalized anxiety disorder: Secondary | ICD-10-CM

## 2024-11-01 ENCOUNTER — Other Ambulatory Visit: Payer: Self-pay | Admitting: Family Medicine

## 2024-11-01 DIAGNOSIS — F419 Anxiety disorder, unspecified: Secondary | ICD-10-CM

## 2024-11-23 ENCOUNTER — Telehealth: Admitting: Psychiatry

## 2024-11-23 ENCOUNTER — Encounter: Payer: Self-pay | Admitting: Psychiatry

## 2024-11-23 DIAGNOSIS — F411 Generalized anxiety disorder: Secondary | ICD-10-CM

## 2024-11-23 DIAGNOSIS — F4381 Prolonged grief disorder: Secondary | ICD-10-CM

## 2024-11-23 DIAGNOSIS — F3341 Major depressive disorder, recurrent, in partial remission: Secondary | ICD-10-CM

## 2024-11-23 NOTE — Progress Notes (Unsigned)
 Virtual Visit via Video Note  I connected with Annette Bowers on 11/23/2024 at  4:30 PM EST by a video enabled telemedicine application and verified that I am speaking with the correct person using two identifiers.  Location Provider Location : [facility] Patient Location : Home  Participants: Patient , Provider    I discussed the limitations of evaluation and management by telemedicine and the availability of in person appointments. The patient expressed understanding and agreed to proceed.   I discussed the assessment and treatment plan with the patient. The patient was provided an opportunity to ask questions and all were answered. The patient agreed with the plan and demonstrated an understanding of the instructions.   The patient was advised to call back or seek an in-person evaluation if the symptoms worsen or if the condition fails to improve as anticipated.  BH MD OP Progress Note  11/23/2024 4:53 PM Annette Bowers  MRN:  [id number]  Chief Complaint:  Chief Complaint  Patient presents with   Follow-up   Anxiety   Medication Refill   Discussed the use of AI scribe software for clinical note transcription with the patient, who gave verbal consent to proceed.  History of Present Illness Annette Bowers is a 38 year old Caucasian female, engaged, lives in [place], has a history of MDD, GAD, persistent complex bereavement disorder, migraine headaches was evaluated by telemedicine today.  She reports that her mood has remained stable overall, without unusual concerns since her last appointment. She reports that seasonal changes have led her to experience a decrease in mood, particularly a lack of enthusiasm for the holidays, yet she has participated in family gatherings, including Thanksgiving with her fianc's extended family. She continues ongoing engagement in therapy, and although she has spaced out appointments due to financial reasons, she ensures that sessions do not  exceed intervals of 1.5 to 2 months.  She reports that anxiety related to her fianc's employment situation persists, as he currently works for his previous boss at reduced pay and hours after losing his prior job in the spring or summer. She notes that her fianc has attended some promising job interviews, which she hopes will improve her situation. To cope with anxiety, she focuses on meeting her needs and managing as best as she can. She reports that sleep remains generally adequate, with only occasional difficulties.  She takes mirtazapine  at night, though not consistently every night, and sometimes forgets to take it before bed. She has hydroxyzine  25 mg and Xanax  available as needed. She continues to take over-the-counter vitamin D  supplementation as needed.  She denies thoughts about hurting herself or others.  She denies any perceptual disturbances.  She is currently struggling with GI symptoms, nausea, diarrhea and agrees to get in touch with her primary care provider for management of the same.     Visit Diagnosis:    ICD-10-CM   1. Recurrent major depressive disorder, in partial remission  F33.41     2. GAD (generalized anxiety disorder)  F41.1     3. 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  Past trials of trazodone .  Past Medical History:  Past Medical History:  Diagnosis Date   Allergy    Anemia    Anxiety    Encounter for screening for HIV 09/19/2022   Insomnia    Migraine  Moderate major depression (HCC)     Past Surgical History:  Procedure Laterality Date   WISDOM TOOTH EXTRACTION  2011    Family Psychiatric History: I have reviewed family psychiatric history from progress note on 11/05/2023.  Family History:  Family History  Problem Relation Age of Onset   Hyperlipidemia Mother     Hypertension Mother    Diabetes Father    Hypertension Father    Hypertension Maternal Uncle    Diabetes Maternal Grandfather    Diabetes Maternal Grandmother    Heart disease Maternal Grandmother    Hypertension Maternal Grandmother    Arthritis Maternal Grandmother    Kidney failure Maternal Grandmother    Lung cancer Paternal Grandmother    Cancer Paternal Grandmother    Multiple sclerosis Cousin    Mental illness Neg Hx     Social History: I have reviewed social history from progress note on 11/05/2023. Social History   Socioeconomic History   Marital status: Significant Other    Spouse name: Not on file   Number of children: 1   Years of education: Not on file   Highest education level: Associate degree: occupational, scientist, product/process development, or vocational program  Occupational History    Employer: [employer]  Tobacco Use   Smoking status: Former    Current packs/day: 0.50    Average packs/day: 0.5 packs/day for 8.0 years (4.0 ttl pk-yrs)    Types: Cigarettes   Smokeless tobacco: Current   Tobacco comments:    Vaping daily  Vaping Use   Vaping status: Every Day  Substance and Sexual Activity   Alcohol use: Yes    Alcohol/week: 3.0 - 4.0 standard drinks of alcohol    Types: 3 - 4 Glasses of wine per week   Drug use: No   Sexual activity: Yes    Birth control/protection: None  Other Topics Concern   Not on file  Social History Narrative   Not on file   Social Drivers of Health   Tobacco Use: High Risk (11/23/2024)   Patient History    Smoking Tobacco Use: Former    Smokeless Tobacco Use: Current    Passive Exposure: Not on file  Financial Resource Strain: Medium Risk (09/30/2024)   Overall Financial Resource Strain (CARDIA)    Difficulty of Paying Living Expenses: Somewhat hard  Food Insecurity: Food Insecurity Present (09/30/2024)   Epic    Worried About Programme Researcher, Broadcasting/film/video in the Last Year: Sometimes true    Ran Out of Food in the Last Year: Sometimes true   Transportation Needs: No Transportation Needs (09/30/2024)   Epic    Lack of Transportation (Medical): No    Lack of Transportation (Non-Medical): No  Physical Activity: Sufficiently Active (11/11/2023)   Exercise Vital Sign    Days of Exercise per Week: 5 days    Minutes of Exercise per Session: 30 min  Stress: Stress Concern Present (11/11/2023)   [employer] of Occupational Health - Occupational Stress Questionnaire    Feeling of Stress : Very much  Social Connections: Moderately Isolated (11/11/2023)   Social Connection and Isolation Panel    Frequency of Communication with Friends and Family: More than three times a week    Frequency of Social Gatherings with Friends and Family: Once a week    Attends Religious Services: 1 to 4 times per year    Active Member of [employer] or Organizations: No    Attends Engineer, Structural: Not on file    Marital Status: Divorced  Depression (PHQ2-9): High Risk (09/21/2024)   Depression (PHQ2-9)    PHQ-2 Score: 17  Alcohol Screen: Low Risk (11/11/2023)   Alcohol Screen    Last Alcohol Screening Score (AUDIT): 3  Housing: Unknown (09/30/2024)   Epic    Unable to Pay for Housing in the Last Year: No    Number of Times Moved in the Last Year: Not on file    Homeless in the Last Year: No  Utilities: Not At Risk (09/30/2024)   Epic    Threatened with loss of utilities: No  Health Literacy: Adequate Health Literacy (03/18/2024)   B1300 Health Literacy    Frequency of need for help with medical instructions: Never    Allergies: Allergies[1]  Metabolic Disorder Labs: Lab Results  Component Value Date   HGBA1C 5.1 03/18/2024   No results found for: PROLACTIN Lab Results  Component Value Date   CHOL 202 (H) 03/18/2024   TRIG 114 03/18/2024   HDL 64 03/18/2024   CHOLHDL 3.2 03/18/2024   LDLCALC 118 (H) 03/18/2024   LDLCALC 117 (H) 11/11/2023   Lab Results  Component Value Date   TSH 0.608 03/18/2024   TSH 1.580  11/11/2023    Therapeutic Level Labs: No results found for: LITHIUM No results found for: VALPROATE No results found for: CBMZ  Current Medications: Current Outpatient Medications  Medication Sig Dispense Refill   ALPRAZolam  (XANAX ) 0.25 MG tablet TAKE 1 TABLET BY MOUTH DAILY AS NEEDED FOR ANXIETY. CONTINUE HYDROXYZINE  TO ASSIST WITH WEANING AS DIRECTED 30 tablet 2   hydrOXYzine  (ATARAX ) 25 MG tablet Take 0.5-1 tablets (12.5-25 mg total) by mouth 3 (three) times daily as needed for anxiety. 90 tablet 1   lamoTRIgine  (LAMICTAL ) 100 MG tablet TAKE 1 TABLET(100 MG) BY MOUTH DAILY 90 tablet 1   mirtazapine  (REMERON ) 7.5 MG tablet Take 1 tablet (7.5 mg total) by mouth at bedtime. 30 tablet 2   sertraline  (ZOLOFT ) 100 MG tablet TAKE 2 TABLETS(200 MG) BY MOUTH DAILY 180 tablet 1   Vitamin D , Ergocalciferol , (DRISDOL ) 1.25 MG (50000 UNIT) CAPS capsule Take 1 capsule (50,000 Units total) by mouth every 7 (seven) days. 26 capsule 1   No current facility-administered medications for this visit.     Musculoskeletal: Strength & Muscle Tone: UTA Gait & Station: Seated Patient leans: N/A  Psychiatric Specialty Exam: Review of Systems  Psychiatric/Behavioral:  The patient is nervous/anxious.     There were no vitals taken for this visit.There is no height or weight on file to calculate BMI.  General Appearance: Casual  Eye Contact:  Fair  Speech:  Clear and Coherent  Volume:  Normal  Mood:  Anxious  Affect:  Congruent  Thought Process:  Goal Directed and Descriptions of Associations: Intact  Orientation:  Full (Time, Place, and Person)  Thought Content: Logical   Suicidal Thoughts:  No  Homicidal Thoughts:  No  Memory:  Immediate;   Fair Recent;   Fair Remote;   Fair  Judgement:  Fair  Insight:  Fair  Psychomotor Activity:  Normal  Concentration:  Concentration: Fair and Attention Span: Fair  Recall:  [employer] of Knowledge: Fair  Language: Fair  Akathisia:  No  Handed:   Right  AIMS (if indicated): not done  Assets:  Communication Skills Desire for Improvement Housing Intimacy Social Support Talents/Skills Transportation  ADL's:  Intact  Cognition: WNL  Sleep:  varies   Screenings: AUDIT    Loss Adjuster, Chartered Office Visit from 11/11/2023 in [facility]  Family Practice  Alcohol Use Disorder Identification Test Final Score (AUDIT) 3    GAD-7    Flowsheet Row Office Visit from 09/21/2024 in [facility] Counselor from 07/29/2024 in [facility] Office Visit from 03/18/2024 in [facility] Counselor from 01/21/2024 in [facility] Office Visit from 11/11/2023 in [facility]  Total GAD-7 Score 18 9 19 17 16    PHQ2-9    Flowsheet Row Office Visit from 09/21/2024 in [facility] Counselor from 07/29/2024 in [facility] Office Visit from 03/18/2024 in [facility] Counselor from 01/21/2024 in [facility] Office Visit from 11/11/2023 in [facility]  PHQ-2 Total Score 6 4 6 4 5   PHQ-9 Total Score 17 8 17 14 14    Flowsheet Row Video Visit from 11/23/2024 in [facility] Video Visit from 09/03/2024 in [facility] Video Visit from 06/18/2024 in [facility]  C-SSRS RISK CATEGORY No Risk No Risk No Risk     Assessment and Plan: Annette Bowers is a 38 year old Caucasian female who presented for a follow-up appointment, discussed assessment and plan as noted below.  1. Recurrent major depressive disorder, in partial remission Overall managing her mood symptoms although continues to have low mood which is multifactorial including  current GI illness, husband's job loss and other situational stressors. Encouraged to continue psychotherapy sessions. Continue Sertraline  200 mg daily Continue Lamotrigine  100 mg daily Continue Mirtazapine  7.5 mg at bedtime, encouraged to use it daily, reports she forgets to take it.  Encouraged compliance.  2. GAD (generalized anxiety disorder)-improving Currently coping better although continues to have anxiety mostly situational Encouraged to continue CBT with Ms. Perkins Continue Sertraline  as prescribed Continue Hydroxyzine  25 mg up to 2-3 times a day for severe anxiety attacks Continue Xanax  0.25 mg daily as needed for anxiety, prescribed by primary care provider. Reviewed  PMP AWARxE   3. Persistent complex bereavement disorder-improving Reports holiday season can be challenging although she has been coping better. Continue CBT with Ms. Perkins  Follow-up Follow-up in clinic in 6 weeks or sooner in person.    Collaboration of Care: Collaboration of Care: Primary Care Provider AEB encouraged to follow-up with primary care provider for her GI symptoms.  Encouraged to follow-up with psychotherapist.  Patient/Guardian was advised Release of Information must be obtained prior to any record release in order to collaborate their care with an outside provider. Patient/Guardian was advised if they have not already done so to contact the registration department to sign all necessary forms in order for us  to release information regarding their care.   Consent: Patient/Guardian gives verbal consent for treatment and assignment of benefits for services provided during this visit. Patient/Guardian expressed understanding and agreed to proceed.   This note was generated in part or whole with voice recognition software. Voice recognition is usually quite accurate but there are transcription errors that can and very often do occur. I apologize for any typographical errors that were not  detected and corrected.    Zen Felling, MD 11/24/2024, 10:57 AM     [1]  Allergies Allergen Reactions   Promethazine Hcl     drowsy   Topiramate     drowsy

## 2024-12-06 ENCOUNTER — Ambulatory Visit: Admitting: Licensed Clinical Social Worker

## 2024-12-13 ENCOUNTER — Ambulatory Visit: Admitting: Licensed Clinical Social Worker

## 2024-12-13 DIAGNOSIS — F411 Generalized anxiety disorder: Secondary | ICD-10-CM | POA: Diagnosis not present

## 2024-12-13 DIAGNOSIS — F4381 Prolonged grief disorder: Secondary | ICD-10-CM

## 2024-12-13 DIAGNOSIS — F3341 Major depressive disorder, recurrent, in partial remission: Secondary | ICD-10-CM | POA: Diagnosis not present

## 2024-12-13 NOTE — Progress Notes (Signed)
 "  THERAPIST PROGRESS NOTE  Virtual Visit via Video Note  I connected with Annette Bowers Friend on 12/13/2024 at 3:01pm by a video enabled telemedicine application and verified that I am speaking with the correct person using two identifiers.  Location: Patient: Address on file  Provider: Providers address   I discussed the limitations of evaluation and management by telemedicine and the availability of in person appointments. The patient expressed understanding and agreed to proceed.   I discussed the assessment and treatment plan with the patient. The patient was provided an opportunity to ask questions and all were answered. The patient agreed with the plan and demonstrated an understanding of the instructions.   The patient was advised to call back or seek an in-person evaluation if the symptoms worsen or if the condition fails to improve as anticipated.  I provided 48 minutes of non-face-to-face time during this encounter.   [name], LCSW   Session Time: 3:01pm-  Participation Level: Active  Behavioral Response: CasualAlertDepressed  Type of Therapy: Individual Therapy  Treatment Goals addressed:  Active     Anxiety     LTG: Journi will score less than 5 on the Generalized Anxiety Disorder 7 Scale (GAD-7)  (Progressing)     Start:  01/21/24    Expected End:  04/12/25         STG:  address issues from the past and learn to cope   (Progressing)     Start:  01/21/24    Expected End:  04/12/25       Goal Note     12/13/24: Reports she is experiencing food insecurity which results in a great deal of anxiety about the future and access to resources. Shares she has been able to problem solve looking into resources and asking for help.          Work with patient individually to identify the major components of a recent episode of anxiety: physical symptoms, major thoughts and images, and major behaviors they experienced     Start:  01/21/24         Work with  Annette to identify a minimum of 3 consequences of avoidance.      Start:  01/21/24         Work with Annette to identify a minimum of 3 alternative coping behaviors to avoidance.      Start:  01/21/24         Coping Skills      Start:  01/21/24       Will work with the pt using CBT/DBT techniques to help the pt verbalize an understanding of the cognitive, physiological, and behavioral components of anxiety and its treatment. This will be done by using worksheets, interactive activities, CBT/ABC thought logs, modeling, homework, role playing and journaling. Will work with pt to learn and implement coping skills that result in a reduction of anxiety and improve daily functioning per pt self report 3 out of 5 documented sessions.         BH CCP Acute or Chronic Trauma Reaction     LTG: Recall traumatic events without becoming overwhelmed with negative emotions (Not Progressing)     Start:  01/21/24    Expected End:  04/12/25       Goal Note     12/13/24: Reports in the past 2 weeks she has experienced a handful of nightmares about altered past experiences related to deceased loved.          STG:  Laekyn will identify coping strategies to deal with trauma memories and the associated emotional reaction (Not Progressing)     Start:  01/21/24    Expected End:  04/12/25         Gabryel will participate in developing a concrete plan for increasing social contacts to form a social support network     Start:  01/21/24         Teach Annette coping strategies (e.g., writing down thoughts and feelings in a journal; taking deep, slow breaths; calling a support person to talk about memories) to deal with trauma memories and sudden emotional reactions without becoming emotionally n     Start:  01/21/24         Educate Annette on trauma influenced cognitive distortions     Start:  01/21/24           OP Depression     LTG: Reduce frequency, intensity, and duration of depression  symptoms so that daily functioning is improved (Progressing)     Start:  01/21/24    Expected End:  04/12/25         Demonstrates progress in stages of grief at own pace (Not Progressing)     Start:  01/21/24    Expected End:  04/12/25          Goal Note     12/13/24: Shares the holidays have been difficult with grieving the death of her son. Shares her depression has been higher as a result.         Therapist will educate patient on cognitive distortions and the rationale for treatment of depression     Start:  01/21/24         Jeroline will identify 2 cognitive distortions they are currently using and write reframing statements to replace them     Start:  01/21/24         Coping Skills      Start:  01/21/24       Will work with the pt using CBT/DBT techniques to help the pt verbalize an understanding of the cognitive, physiological, and behavioral components of depression and its treatment. This will be done by using worksheets, interactive activities, CBT/ABC thought logs, modeling, homework, role playing and journaling. Will work with pt to learn and implement coping skills that result in a reduction of depression and improve daily functioning per pt self report 3 out of 5 documented sessions.        Educate patient on: Stages of grief     Start:  01/21/24             ProgressTowards Goals: Progressing  Interventions: Strength-based, Supportive, and Other: Grief Work   Summary:[name] is a 39 y.o. female who presents with Hopelessness, Sleep (too much or little), Irritability, Worthlessness, Difficulty concentrating, Fatigue, Irritability, Restlessness, Sleep, Tension, and Worrying. Pt was oriented times 5. Pt was cooperative and engaged. Pt denies SI/HI/AVH.   Cln utilized the first half of session to review patients progress. See progress notes documented above.   The clinician readministered the PHQ-9 and GAD-7 assessments. The patient's anxiety scores  decreased from 9 to 8, and depression scores also remained at 8. The patient shares she has been participating in family functions more. Shares the holidays have been difficult with grieving the death of her son. Shares her depression has been higher as a result.  Reflected on her understanding of how various individuals in her life cope with grief differently and ways  in which she can utilize assertive communication to establish boundaries moving forward.  Reports in the past 2 weeks she has experienced a handful of nightmares about altered past experiences related to deceased loved. Reports food insecurity is present and they are stressed about finances. Shares she has support from family for paying for medications. Shares she is looking into resources for food.   Patient reflected on newfound hope identifying she rekindled a connection with her younger brother who reached out over the holiday season.  Identified ways in which they have been able to bond over various experiences as well as their own mental health journeys.  Explored how this added level of support has aided in helping her cope through the holiday season.  Suicidal/Homicidal: Nowithout intent/plan  Therapist Response: Clinician utilized active and supportive reflection to create a safe space for patient to process recent life events. Clinician assessed for current stressors, symptoms, safety since last session. The clinician readministered the PHQ-9 and GAD-7 assessments. Updated patients TP.  Reflected on ways in which patient can utilize assertive communication to explore mutual understanding with others and her support system related to appropriate conversations regarding the death of her son.  Reflected on how the patient coped through feelings of grief during this holiday season.  Clinician praised patient's efforts to challenge former tendencies to avoid.  Plan: Return again in 4 weeks.  Diagnosis: Recurrent major depressive  disorder, in partial remission  GAD (generalized anxiety disorder)  Persistent complex bereavement disorder   Collaboration of Care: AEB psychiatrist can access notes and cln. Will review psychiatrists' notes. Check in with the patient and will see LCSW per availability. Patient agreed with treatment recommendations.   Patient/Guardian was advised Release of Information must be obtained prior to any record release in order to collaborate their care with an outside provider. Patient/Guardian was advised if they have not already done so to contact the registration department to sign all necessary forms in order for us  to release information regarding their care.   Consent: Patient/Guardian gives verbal consent for treatment and assignment of benefits for services provided during this visit. Patient/Guardian expressed understanding and agreed to proceed.   [name], LCSW 12/13/2024  "

## 2025-01-10 ENCOUNTER — Ambulatory Visit (INDEPENDENT_AMBULATORY_CARE_PROVIDER_SITE_OTHER): Admitting: Licensed Clinical Social Worker

## 2025-01-10 DIAGNOSIS — F411 Generalized anxiety disorder: Secondary | ICD-10-CM | POA: Diagnosis not present

## 2025-01-10 DIAGNOSIS — F3341 Major depressive disorder, recurrent, in partial remission: Secondary | ICD-10-CM

## 2025-01-10 DIAGNOSIS — F4381 Prolonged grief disorder: Secondary | ICD-10-CM | POA: Diagnosis not present

## 2025-01-11 ENCOUNTER — Ambulatory Visit: Admitting: Psychiatry

## 2025-02-14 ENCOUNTER — Ambulatory Visit: Admitting: Licensed Clinical Social Worker

## 2025-03-08 ENCOUNTER — Ambulatory Visit: Admitting: Psychiatry

## 2025-03-22 ENCOUNTER — Encounter: Admitting: Family Medicine
